# Patient Record
Sex: Male | Born: 1960 | Race: White | Hispanic: No | Marital: Married | Smoking: Former smoker
Health system: Southern US, Community
[De-identification: ages and names within clinical notes are randomized; demographics above are authoritative.]

## PROBLEM LIST (undated history)

## (undated) DIAGNOSIS — G459 Transient cerebral ischemic attack, unspecified: Secondary | ICD-10-CM

## (undated) DIAGNOSIS — I639 Cerebral infarction, unspecified: Secondary | ICD-10-CM

## (undated) DIAGNOSIS — J329 Chronic sinusitis, unspecified: Secondary | ICD-10-CM

## (undated) DIAGNOSIS — I10 Essential (primary) hypertension: Secondary | ICD-10-CM

## (undated) DIAGNOSIS — E785 Hyperlipidemia, unspecified: Secondary | ICD-10-CM

## (undated) DIAGNOSIS — M109 Gout, unspecified: Secondary | ICD-10-CM

## (undated) DIAGNOSIS — Z8673 Personal history of transient ischemic attack (TIA), and cerebral infarction without residual deficits: Secondary | ICD-10-CM

## (undated) DIAGNOSIS — I2510 Atherosclerotic heart disease of native coronary artery without angina pectoris: Secondary | ICD-10-CM

## (undated) DIAGNOSIS — N200 Calculus of kidney: Secondary | ICD-10-CM

## (undated) HISTORY — DX: Calculus of kidney: N20.0

---

## 1994-11-19 DIAGNOSIS — I2510 Atherosclerotic heart disease of native coronary artery without angina pectoris: Secondary | ICD-10-CM

## 1994-11-19 HISTORY — DX: Atherosclerotic heart disease of native coronary artery without angina pectoris: I25.10

## 1994-12-12 HISTORY — PX: CORONARY ARTERY BYPASS GRAFT: SHX141

## 2004-09-21 ENCOUNTER — Ambulatory Visit: Payer: Self-pay | Admitting: Cardiovascular Disease

## 2004-09-27 ENCOUNTER — Ambulatory Visit: Payer: Self-pay

## 2012-07-08 ENCOUNTER — Encounter (INDEPENDENT_AMBULATORY_CARE_PROVIDER_SITE_OTHER): Payer: Self-pay | Admitting: *Deleted

## 2012-07-15 ENCOUNTER — Encounter (INDEPENDENT_AMBULATORY_CARE_PROVIDER_SITE_OTHER): Payer: Self-pay | Admitting: *Deleted

## 2012-07-30 ENCOUNTER — Encounter (INDEPENDENT_AMBULATORY_CARE_PROVIDER_SITE_OTHER): Payer: Self-pay | Admitting: *Deleted

## 2012-07-30 ENCOUNTER — Telehealth (INDEPENDENT_AMBULATORY_CARE_PROVIDER_SITE_OTHER): Payer: Self-pay | Admitting: *Deleted

## 2012-07-30 NOTE — Telephone Encounter (Signed)
Wife [place] Sink called and wanted to set-up TCS  Did a very brief [place] Knock and gave date it may work at hospital.  She had lots of dates that would not work due to vacation etc.  He had bypass surgery back when he was in his 38's and now has a new small blockage.  He is on  ASA 81 mg 2 daily   Questor 20 mg 1 daily   Zetia 10 mg 1 daily   Metroprolonol 25 mg 1 am and 1 pm   Prilosec 20 mg 1 PRN  This is for a screening and he does have a f/h grandfather around 6 and did die from metastatic disease  Told her 7/17 @ 1 be at hosptial at 12  But you review and call her back with questions

## 2012-07-31 ENCOUNTER — Encounter (INDEPENDENT_AMBULATORY_CARE_PROVIDER_SITE_OTHER): Payer: Self-pay | Admitting: *Deleted

## 2012-07-31 ENCOUNTER — Other Ambulatory Visit (INDEPENDENT_AMBULATORY_CARE_PROVIDER_SITE_OTHER): Payer: Self-pay | Admitting: *Deleted

## 2012-07-31 ENCOUNTER — Telehealth (INDEPENDENT_AMBULATORY_CARE_PROVIDER_SITE_OTHER): Payer: Self-pay | Admitting: *Deleted

## 2012-07-31 DIAGNOSIS — Z1211 Encounter for screening for malignant neoplasm of colon: Secondary | ICD-10-CM

## 2012-07-31 DIAGNOSIS — Z800 Family history of malignant neoplasm of digestive organs: Secondary | ICD-10-CM

## 2012-07-31 NOTE — Telephone Encounter (Signed)
Triage reviewed, instructions mailed to patient

## 2012-07-31 NOTE — Telephone Encounter (Signed)
Patient needs movi prep 

## 2012-07-31 NOTE — Progress Notes (Signed)
This encounter was created in error - please disregard.

## 2012-08-03 MED ORDER — PEG-KCL-NACL-NASULF-NA ASC-C 100 G PO SOLR: 1.0000 | Freq: Once | ORAL | Status: DC

## 2012-08-04 ENCOUNTER — Telehealth (INDEPENDENT_AMBULATORY_CARE_PROVIDER_SITE_OTHER): Payer: Self-pay | Admitting: *Deleted

## 2012-08-04 NOTE — Telephone Encounter (Signed)
  Procedure: tcs  Reason/Indication:  Screening, fam hx colon ca  Has patient had this procedure before?  no  If so, when, by whom and where?    Is there a family history of colon cancer?  Yes, grandfather  Who?  What age when diagnosed?    Is patient diabetic?   no      Does patient have prosthetic heart valve?  no  Do you have a pacemaker?  no  Has patient ever had endocarditis? no  Has patient had joint replacement within last 12 months?  no  Is patient on Coumadin, Plavix and/or Aspirin? yes  Medications: asa 81 mg bid, crestor 20 mg daily, zetia 10 mg daily, metoprolol 25 mg 1 in am & 1 in pm, prilosec 20 mg prn  Allergies: nkda  Medication Adjustment: asa 2 days  Procedure date & time: 09/03/12 at 1200

## 2012-08-04 NOTE — Telephone Encounter (Signed)
agree

## 2012-08-20 ENCOUNTER — Encounter (HOSPITAL_COMMUNITY): Payer: Self-pay | Admitting: Pharmacy Technician

## 2012-09-03 ENCOUNTER — Encounter (HOSPITAL_COMMUNITY): Payer: Self-pay

## 2012-09-03 ENCOUNTER — Ambulatory Visit (HOSPITAL_COMMUNITY)
Admission: RE | Admit: 2012-09-03 | Discharge: 2012-09-03 | Disposition: A | Discharge status: Home / Self Care | Payer: BC Managed Care – PPO | Source: Ambulatory Visit | Attending: Internal Medicine | Admitting: Internal Medicine

## 2012-09-03 ENCOUNTER — Encounter (HOSPITAL_COMMUNITY)
Admission: RE | Disposition: A | Discharge status: Home / Self Care | Payer: Self-pay | Source: Ambulatory Visit | Attending: Internal Medicine

## 2012-09-03 DIAGNOSIS — Z800 Family history of malignant neoplasm of digestive organs: Secondary | ICD-10-CM

## 2012-09-03 DIAGNOSIS — Z1211 Encounter for screening for malignant neoplasm of colon: Secondary | ICD-10-CM

## 2012-09-03 DIAGNOSIS — D126 Benign neoplasm of colon, unspecified: Secondary | ICD-10-CM

## 2012-09-03 DIAGNOSIS — Z7982 Long term (current) use of aspirin: Secondary | ICD-10-CM | POA: Insufficient documentation

## 2012-09-03 DIAGNOSIS — I2510 Atherosclerotic heart disease of native coronary artery without angina pectoris: Secondary | ICD-10-CM | POA: Insufficient documentation

## 2012-09-03 DIAGNOSIS — Z951 Presence of aortocoronary bypass graft: Secondary | ICD-10-CM | POA: Insufficient documentation

## 2012-09-03 DIAGNOSIS — I10 Essential (primary) hypertension: Secondary | ICD-10-CM | POA: Insufficient documentation

## 2012-09-03 DIAGNOSIS — Z79899 Other long term (current) drug therapy: Secondary | ICD-10-CM | POA: Insufficient documentation

## 2012-09-03 HISTORY — PX: COLONOSCOPY: SHX5424

## 2012-09-03 SURGERY — COLONOSCOPY
Anesthesia: Moderate Sedation

## 2012-09-03 MED ORDER — STERILE WATER FOR IRRIGATION IR SOLN
Status: DC | PRN
  Administered 2012-09-03: 12:00:00

## 2012-09-03 MED ORDER — MIDAZOLAM HCL 5 MG/5ML IJ SOLN
INTRAMUSCULAR | Status: AC
  Filled 2012-09-03: qty 10

## 2012-09-03 MED ORDER — MEPERIDINE HCL 50 MG/ML IJ SOLN
INTRAMUSCULAR | Status: AC
  Filled 2012-09-03: qty 1

## 2012-09-03 MED ORDER — MEPERIDINE HCL 50 MG/ML IJ SOLN
INTRAMUSCULAR | Status: DC | PRN
  Administered 2012-09-03 (×2): 25 mg via INTRAVENOUS

## 2012-09-03 MED ORDER — SODIUM CHLORIDE 0.9 % IV SOLN
INTRAVENOUS | Status: DC
  Administered 2012-09-03: 11:00:00 via INTRAVENOUS

## 2012-09-03 MED ORDER — MIDAZOLAM HCL 5 MG/5ML IJ SOLN
INTRAMUSCULAR | Status: DC | PRN
  Administered 2012-09-03 (×3): 2 mg via INTRAVENOUS
  Administered 2012-09-03: 1 mg via INTRAVENOUS

## 2012-09-03 NOTE — H&P (Signed)
Justin Sexton is an 52 y.o. male.   Chief Complaint: Patient is here for colonoscopy. HPI: Patient is 52 year old Caucasian male who presents for screening colonoscopy. He denies abdominal pain change in bowel habits or rectal bleeding. Family history is negative for colorectal carcinoma.  Past Medical History  Diagnosis Date  . Hypertension   . H/O coronary artery bypass surgery     16 years ago, [place]    History reviewed. No pertinent past surgical history.  History reviewed. No pertinent family history. Social History:  reports that he has never smoked. He does not have any smokeless tobacco history on file. He reports that he does not drink alcohol or use illicit drugs.  Allergies: No Known Allergies  Medications Prior to Admission  Medication Sig Dispense Refill  . aspirin 325 MG tablet Take 325 mg by mouth daily.      . calcium-vitamin D (OSCAL WITH D) 500-200 MG-UNIT per tablet Take 1 tablet by mouth daily.      Marland Kitchen ezetimibe (ZETIA) 10 MG tablet Take 10 mg by mouth daily.      . fish oil-omega-3 fatty acids 1000 MG capsule Take 1 g by mouth daily.      . metoprolol (LOPRESSOR) 50 MG tablet Take 25 mg by mouth 2 (two) times daily.      . peg 3350 powder (MOVIPREP) 100 G SOLR Take 1 kit (100 g total) by mouth once.  1 kit  0  . rosuvastatin (CRESTOR) 20 MG tablet Take 20 mg by mouth daily.        No results found for this or any previous visit (from the past 48 hour(s)). No results found.  ROS  Blood pressure 134/74, pulse 59, temperature 98 F (36.7 C), temperature source Oral, resp. rate 18, height 5\' 10"  (1.778 m), weight 265 lb (120.203 kg), SpO2 95.00%. Physical Exam  Constitutional: He appears well-developed and well-nourished.  HENT:  Mouth/Throat: Oropharynx is clear and moist.  Eyes: Conjunctivae are normal. No scleral icterus.  Neck: No thyromegaly present.  Cardiovascular: Normal rate, regular rhythm and normal heart sounds.   No murmur  heard. Respiratory: Effort normal and breath sounds normal.  GI: He exhibits no distension and no mass. There is no tenderness.  Musculoskeletal: He exhibits no edema.  Lymphadenopathy:    He has no cervical adenopathy.  Neurological: He is alert.  Skin: Skin is warm and dry.     Assessment/Plan Average risk screening colonoscopy.  [name],[name] U 09/03/2012, 11:32 AM

## 2012-09-03 NOTE — Op Note (Signed)
COLONOSCOPY PROCEDURE REPORT  PATIENT:  Justin Sexton  MR#:  [id number] Birthdate:  Oct 29, 1960, 52 y.o., male Endoscopist:  Dr. Malissa Hippo, MD Referred By:  Dr. Ignatius Specking, MD Procedure Date: 09/03/2012  Procedure:   Colonoscopy  Indications: Patient is 52 year old Caucasian male who is undergoing average risk screening colonoscopy.  Informed Consent:  The procedure and risks were reviewed with the patient and informed consent was obtained.  Medications:  Demerol 50 mg IV Versed 7 mg IV  Description of procedure:  After a digital rectal exam was performed, that colonoscope was advanced from the anus through the rectum and colon to the area of the cecum, ileocecal valve and appendiceal orifice. The cecum was deeply intubated. These structures were well-seen and photographed for the record. From the level of the cecum and ileocecal valve, the scope was slowly and cautiously withdrawn. The mucosal surfaces were carefully surveyed utilizing scope tip to flexion to facilitate fold flattening as needed. The scope was pulled down into the rectum where a thorough exam including retroflexion was performed.  Findings:   Prep satisfactory. Two small polyps ablated via cold biopsy and submitted together. One was located at transverse colon and the second one at sigmoid colon. No other mucosal abnormalities noted. Rectal mucosa and anal rectal junction unremarkable.   Therapeutic/Diagnostic Maneuvers Performed:  See above  Complications:  None  Cecal Withdrawal Time:  15 minutes  Impression:  Examination performed to cecum. Two small polyps ablated via cold biopsy and submitted together(transverse and sigmoid colon).  Recommendations:  Standard instructions given. I will contact patient with biopsy results and further recommendations.   Coni Homesley U  09/03/2012 12:17 PM  CC: Dr. Ignatius Specking., MD & Dr. Bonnetta Barry ref. provider found

## 2012-09-04 ENCOUNTER — Encounter (HOSPITAL_COMMUNITY): Payer: Self-pay | Admitting: Internal Medicine

## 2012-09-09 ENCOUNTER — Encounter (INDEPENDENT_AMBULATORY_CARE_PROVIDER_SITE_OTHER): Payer: Self-pay | Admitting: *Deleted

## 2013-09-18 HISTORY — PX: OTHER SURGICAL HISTORY: SHX169

## 2013-09-28 ENCOUNTER — Inpatient Hospital Stay (HOSPITAL_BASED_OUTPATIENT_CLINIC_OR_DEPARTMENT_OTHER)
Admission: EM | Admit: 2013-09-28 | Discharge: 2013-10-01 | DRG: 042 | Disposition: A | Discharge status: Home / Self Care | Payer: BC Managed Care – PPO | Attending: Internal Medicine | Admitting: Internal Medicine

## 2013-09-28 ENCOUNTER — Emergency Department (HOSPITAL_BASED_OUTPATIENT_CLINIC_OR_DEPARTMENT_OTHER): Payer: BC Managed Care – PPO

## 2013-09-28 ENCOUNTER — Encounter (HOSPITAL_BASED_OUTPATIENT_CLINIC_OR_DEPARTMENT_OTHER): Payer: Self-pay | Admitting: Emergency Medicine

## 2013-09-28 ENCOUNTER — Observation Stay (HOSPITAL_COMMUNITY): Payer: BC Managed Care – PPO

## 2013-09-28 DIAGNOSIS — Z87891 Personal history of nicotine dependence: Secondary | ICD-10-CM

## 2013-09-28 DIAGNOSIS — R209 Unspecified disturbances of skin sensation: Secondary | ICD-10-CM | POA: Diagnosis present

## 2013-09-28 DIAGNOSIS — I6350 Cerebral infarction due to unspecified occlusion or stenosis of unspecified cerebral artery: Secondary | ICD-10-CM | POA: Diagnosis not present

## 2013-09-28 DIAGNOSIS — I10 Essential (primary) hypertension: Secondary | ICD-10-CM | POA: Diagnosis present

## 2013-09-28 DIAGNOSIS — G459 Transient cerebral ischemic attack, unspecified: Secondary | ICD-10-CM

## 2013-09-28 DIAGNOSIS — I639 Cerebral infarction, unspecified: Secondary | ICD-10-CM

## 2013-09-28 DIAGNOSIS — G458 Other transient cerebral ischemic attacks and related syndromes: Secondary | ICD-10-CM | POA: Diagnosis not present

## 2013-09-28 DIAGNOSIS — M109 Gout, unspecified: Secondary | ICD-10-CM | POA: Diagnosis present

## 2013-09-28 DIAGNOSIS — Z951 Presence of aortocoronary bypass graft: Secondary | ICD-10-CM

## 2013-09-28 DIAGNOSIS — I2510 Atherosclerotic heart disease of native coronary artery without angina pectoris: Secondary | ICD-10-CM | POA: Diagnosis present

## 2013-09-28 DIAGNOSIS — R5383 Other fatigue: Secondary | ICD-10-CM

## 2013-09-28 DIAGNOSIS — Z79899 Other long term (current) drug therapy: Secondary | ICD-10-CM

## 2013-09-28 DIAGNOSIS — E785 Hyperlipidemia, unspecified: Secondary | ICD-10-CM | POA: Diagnosis present

## 2013-09-28 DIAGNOSIS — R5381 Other malaise: Secondary | ICD-10-CM | POA: Diagnosis present

## 2013-09-28 DIAGNOSIS — Z8673 Personal history of transient ischemic attack (TIA), and cerebral infarction without residual deficits: Secondary | ICD-10-CM

## 2013-09-28 DIAGNOSIS — Z8249 Family history of ischemic heart disease and other diseases of the circulatory system: Secondary | ICD-10-CM

## 2013-09-28 HISTORY — DX: Hyperlipidemia, unspecified: E78.5

## 2013-09-28 HISTORY — DX: Transient cerebral ischemic attack, unspecified: G45.9

## 2013-09-28 HISTORY — DX: Gout, unspecified: M10.9

## 2013-09-28 LAB — CBC
HCT: 45.1 % (ref 39.0–52.0)
HEMOGLOBIN: 15.4 g/dL (ref 13.0–17.0)
MCH: 30.4 pg (ref 26.0–34.0)
MCHC: 34.1 g/dL (ref 30.0–36.0)
MCV: 89.1 fL (ref 78.0–100.0)
Platelets: 202 10*3/uL (ref 150–400)
RBC: 5.06 MIL/uL (ref 4.22–5.81)
RDW: 11.8 % (ref 11.5–15.5)
WBC: 7.7 10*3/uL (ref 4.0–10.5)

## 2013-09-28 LAB — CBC WITH DIFFERENTIAL/PLATELET
BASOS ABS: 0 10*3/uL (ref 0.0–0.1)
BASOS PCT: 0 % (ref 0–1)
EOS ABS: 0.2 10*3/uL (ref 0.0–0.7)
EOS PCT: 3 % (ref 0–5)
HEMATOCRIT: 45.1 % (ref 39.0–52.0)
Hemoglobin: 15.3 g/dL (ref 13.0–17.0)
Lymphocytes Relative: 31 % (ref 12–46)
Lymphs Abs: 2.3 10*3/uL (ref 0.7–4.0)
MCH: 30.3 pg (ref 26.0–34.0)
MCHC: 33.9 g/dL (ref 30.0–36.0)
MCV: 89.3 fL (ref 78.0–100.0)
MONO ABS: 0.5 10*3/uL (ref 0.1–1.0)
Monocytes Relative: 7 % (ref 3–12)
NEUTROS ABS: 4.3 10*3/uL (ref 1.7–7.7)
Neutrophils Relative %: 58 % (ref 43–77)
Platelets: 175 10*3/uL (ref 150–400)
RBC: 5.05 MIL/uL (ref 4.22–5.81)
RDW: 11.7 % (ref 11.5–15.5)
WBC: 7.3 10*3/uL (ref 4.0–10.5)

## 2013-09-28 LAB — COMPREHENSIVE METABOLIC PANEL
ALBUMIN: 4.5 g/dL (ref 3.5–5.2)
ALT: 35 U/L (ref 0–53)
AST: 27 U/L (ref 0–37)
Alkaline Phosphatase: 86 U/L (ref 39–117)
Anion gap: 16 — ABNORMAL HIGH (ref 5–15)
BUN: 19 mg/dL (ref 6–23)
CALCIUM: 10.1 mg/dL (ref 8.4–10.5)
CO2: 22 mEq/L (ref 19–32)
CREATININE: 1 mg/dL (ref 0.50–1.35)
Chloride: 104 mEq/L (ref 96–112)
GFR calc non Af Amer: 85 mL/min — ABNORMAL LOW (ref >90)
Glucose, Bld: 141 mg/dL — ABNORMAL HIGH (ref 70–99)
Potassium: 4.2 mEq/L (ref 3.7–5.3)
Sodium: 142 mEq/L (ref 137–147)
TOTAL PROTEIN: 8 g/dL (ref 6.0–8.3)
Total Bilirubin: 0.6 mg/dL (ref 0.3–1.2)

## 2013-09-28 LAB — TROPONIN I: Troponin I: <0.3 ng/mL (ref <0.30)

## 2013-09-28 LAB — CBG MONITORING, ED: Glucose-Capillary: 126 mg/dL — ABNORMAL HIGH (ref 70–99)

## 2013-09-28 LAB — CREATININE, SERUM
Creatinine, Ser: 0.82 mg/dL (ref 0.50–1.35)
GFR calc Af Amer: >90 mL/min (ref >90)

## 2013-09-28 MED ORDER — LORATADINE 10 MG PO TABS
10.0000 mg | ORAL_TABLET | Freq: Every day | ORAL | Status: DC
  Administered 2013-09-29 – 2013-09-30 (×3): 10 mg via ORAL
  Filled 2013-09-28 (×3): qty 1

## 2013-09-28 MED ORDER — SENNOSIDES-DOCUSATE SODIUM 8.6-50 MG PO TABS: 1.0000 | ORAL_TABLET | Freq: Every evening | ORAL | Status: DC | PRN

## 2013-09-28 MED ORDER — NITROGLYCERIN 0.4 MG SL SUBL: 0.4000 mg | SUBLINGUAL_TABLET | SUBLINGUAL | Status: DC | PRN

## 2013-09-28 MED ORDER — METOPROLOL TARTRATE 25 MG PO TABS
25.0000 mg | ORAL_TABLET | Freq: Two times a day (BID) | ORAL | Status: DC
  Administered 2013-09-28 – 2013-09-30 (×5): 25 mg via ORAL
  Filled 2013-09-28 (×5): qty 1

## 2013-09-28 MED ORDER — LEVOCETIRIZINE DIHYDROCHLORIDE 5 MG PO TABS
5.0000 mg | ORAL_TABLET | Freq: Every day | ORAL | Status: DC
  Filled 2013-09-28: qty 1

## 2013-09-28 MED ORDER — ATORVASTATIN CALCIUM 40 MG PO TABS
40.0000 mg | ORAL_TABLET | Freq: Every day | ORAL | Status: DC
  Administered 2013-09-29 – 2013-10-01 (×3): 40 mg via ORAL
  Filled 2013-09-28 (×4): qty 1

## 2013-09-28 MED ORDER — ENOXAPARIN SODIUM 30 MG/0.3ML ~~LOC~~ SOLN
30.0000 mg | SUBCUTANEOUS | Status: DC
  Administered 2013-09-28 – 2013-09-30 (×3): 30 mg via SUBCUTANEOUS
  Filled 2013-09-28 (×3): qty 0.3

## 2013-09-28 MED ORDER — EZETIMIBE 10 MG PO TABS
10.0000 mg | ORAL_TABLET | Freq: Every day | ORAL | Status: DC
  Administered 2013-09-28 – 2013-09-30 (×3): 10 mg via ORAL
  Filled 2013-09-28 (×3): qty 1

## 2013-09-28 MED ORDER — STROKE: EARLY STAGES OF RECOVERY BOOK
Freq: Once | Status: AC
  Administered 2013-09-28: 20:00:00
  Filled 2013-09-28: qty 1

## 2013-09-28 MED ORDER — ASPIRIN 325 MG PO TABS
325.0000 mg | ORAL_TABLET | Freq: Every day | ORAL | Status: DC
  Administered 2013-09-28 – 2013-09-30 (×3): 325 mg via ORAL
  Filled 2013-09-28 (×3): qty 1

## 2013-09-28 NOTE — ED Provider Notes (Signed)
CSN: [id number]     Arrival date & time 09/28/13  1306 History   First MD Initiated Contact with Patient 09/28/13 1325     Chief Complaint  Patient presents with  . Extremity Weakness     (Consider location/radiation/quality/duration/timing/severity/associated sxs/prior Treatment) Patient is a 53 y.o. male presenting with weakness. The history is provided by the patient. No language interpreter was used.  Weakness This is a new problem. The current episode started today. The problem occurs constantly. The problem has been resolved. Associated symptoms include weakness. Nothing aggravates the symptoms. He has tried nothing for the symptoms. The treatment provided no relief.   Pt began having numbness and tingling to his right arm and right leg around 12;30.   Pt was sweeping at work and notice it was hard to move his right arm and his right leg.   Pt reports he had a TIA several years ago and was concerned because smptoms are similar.  Pt reports symptoms almost resolved by the time he got here.   Pt reports some tingling in his arm that resolved prior to Ct.   Pt has history of htn, cad with bypass and hyperglycemia.   Past Medical History  Diagnosis Date  . Hypertension   . H/O coronary artery bypass surgery     16 years ago, [place]  . TIA (transient ischemic attack)   . Hyperlipidemia   . Gout    Past Surgical History  Procedure Laterality Date  . Colonoscopy N/A 09/03/2012    Procedure: COLONOSCOPY;  Surgeon: Rogene Houston, MD;  [facility]: [facility];  Service: Endoscopy;  Laterality: N/A;  1200  . Coronary artery bypass graft     No family history on file. History  Substance Use Topics  . Smoking status: Former Research scientist (life sciences)  . Smokeless tobacco: Not on file  . Alcohol Use: No    Review of Systems  Neurological: Positive for weakness.  All other systems reviewed and are negative.     Allergies  Codeine  Home Medications   Prior to Admission medications    Medication Sig Start Date End Date Taking? Authorizing Provider  aspirin 325 MG tablet Take 325 mg by mouth daily.    Historical Provider, MD  calcium-vitamin D (OSCAL WITH D) 500-200 MG-UNIT per tablet Take 1 tablet by mouth daily.    Historical Provider, MD  ezetimibe (ZETIA) 10 MG tablet Take 10 mg by mouth daily.    Historical Provider, MD  fish oil-omega-3 fatty acids 1000 MG capsule Take 1 g by mouth daily.    Historical Provider, MD  metoprolol (LOPRESSOR) 50 MG tablet Take 25 mg by mouth 2 (two) times daily.    Historical Provider, MD  rosuvastatin (CRESTOR) 20 MG tablet Take 20 mg by mouth daily.    Historical Provider, MD   BP 178/88  Pulse 88  Temp(Src) 98.1 F (36.7 C) (Oral)  Resp 18  Ht 5\' 10"  (1.778 m)  Wt 276 lb (125.193 kg)  BMI 39.60 kg/m2  SpO2 94% Physical Exam  Nursing note and vitals reviewed. Constitutional: He appears well-developed and well-nourished.  HENT:  Head: Normocephalic and atraumatic.  Eyes: Conjunctivae are normal. Pupils are equal, round, and reactive to light.  Neck: Normal range of motion. Neck supple.  Cardiovascular: Normal rate, regular rhythm and normal heart sounds.   Pulmonary/Chest: Effort normal and breath sounds normal.  Abdominal: Soft.  Musculoskeletal: Normal range of motion.  Neurological: He is alert. He displays normal reflexes. No  cranial nerve deficit. He exhibits normal muscle tone. Coordination normal.  Skin: Skin is warm.  Psychiatric: He has a normal mood and affect.    ED Course  Procedures (including critical care time) Labs Review Labs Reviewed  CBG MONITORING, ED - Abnormal; Notable for the following:    Glucose-Capillary 126 (*)    All other components within normal limits  TROPONIN I  CBC WITH DIFFERENTIAL  COMPREHENSIVE METABOLIC PANEL    Imaging Review No results found.   EKG Interpretation   Date/Time:  Tuesday September 28 2013 13:28:13 EDT Ventricular Rate:  71 PR Interval:  150 QRS Duration:  90 QT Interval:  392 QTC Calculation: 425 R Axis:   28 Text Interpretation:  Normal sinus rhythm Low voltage QRS Cannot rule out  Anterior infarct , age undetermined Abnormal ECG No previous tracing  Confirmed by Betsey Holiday  MD, CHRISTOPHER [phone]) on 09/28/2013 1:30:53 PM      MDM   Final diagnoses:  None    Results for orders placed during the hospital encounter of 09/28/13  TROPONIN I      Result Value Ref Range   Troponin I <0.30  <0.30 ng/mL  CBC WITH DIFFERENTIAL      Result Value Ref Range   WBC 7.3  4.0 - 10.5 K/uL   RBC 5.05  4.22 - 5.81 MIL/uL   Hemoglobin 15.3  13.0 - 17.0 g/dL   HCT 45.1  39.0 - 52.0 %   MCV 89.3  78.0 - 100.0 fL   MCH 30.3  26.0 - 34.0 pg   MCHC 33.9  30.0 - 36.0 g/dL   RDW 11.7  11.5 - 15.5 %   Platelets 175  150 - 400 K/uL   Neutrophils Relative % 58  43 - 77 %   Neutro Abs 4.3  1.7 - 7.7 K/uL   Lymphocytes Relative 31  12 - 46 %   Lymphs Abs 2.3  0.7 - 4.0 K/uL   Monocytes Relative 7  3 - 12 %   Monocytes Absolute 0.5  0.1 - 1.0 K/uL   Eosinophils Relative 3  0 - 5 %   Eosinophils Absolute 0.2  0.0 - 0.7 K/uL   Basophils Relative 0  0 - 1 %   Basophils Absolute 0.0  0.0 - 0.1 K/uL  COMPREHENSIVE METABOLIC PANEL      Result Value Ref Range   Sodium 142  137 - 147 mEq/L   Potassium 4.2  3.7 - 5.3 mEq/L   Chloride 104  96 - 112 mEq/L   CO2 22  19 - 32 mEq/L   Glucose, Bld 141 (*) 70 - 99 mg/dL   BUN 19  6 - 23 mg/dL   Creatinine, Ser 1.00  0.50 - 1.35 mg/dL   Calcium 10.1  8.4 - 10.5 mg/dL   Total Protein 8.0  6.0 - 8.3 g/dL   Albumin 4.5  3.5 - 5.2 g/dL   AST 27  0 - 37 U/L   ALT 35  0 - 53 U/L   Alkaline Phosphatase 86  39 - 117 U/L   Total Bilirubin 0.6  0.3 - 1.2 mg/dL   GFR calc non Af Amer 85 (*) >90 mL/min   GFR calc Af Amer >90  >90 mL/min   Anion gap 16 (*) 5 - 15  CBG MONITORING, ED      Result Value Ref Range   Glucose-Capillary 126 (*) 70 - 99 mg/dL   Ct Head Wo Contrast  09/28/2013  CLINICAL DATA:  Extremity  weakness  EXAM: CT HEAD WITHOUT CONTRAST  TECHNIQUE: Contiguous axial images were obtained from the base of the skull through the vertex without intravenous contrast.  COMPARISON:  None.  FINDINGS: There is no acute intracranial hemorrhage or infarct. No mass lesion or midline shift. Gray-white matter differentiation is well maintained. Ventricles are normal in size without evidence of hydrocephalus. CSF containing spaces are within normal limits. No extra-axial fluid collection.  The calvarium is intact.  Orbital soft tissues are within normal limits.  The paranasal sinuses and mastoid air cells are well pneumatized and free of fluid.  Scalp soft tissues are unremarkable.  IMPRESSION: Normal head CT with no acute intracranial process identified.   Electronically Signed   By: Jeannine Boga M.D.   On: 09/28/2013 13:44       Fransico Meadow, PA-C 09/28/13 1531

## 2013-09-28 NOTE — Progress Notes (Signed)
Spoke with PA-C Sofia about transfer from [name]  53 year old male with a history of hypertension, hyperlipidemia, coronary artery disease with history of CABG. The patient was at work today when he had sudden onset of right upper extremity and right lower extremity numbness and tingling and weakness. The patient also claimed that he had difficulty moving his right arm or leg. Patient presented to the emergency department. In the emergency department, the patient's weakness almost completely resolved but he continued to have some numbness and tingling. CT of the brain was obtained. A time he came back from CT, the patient's symptoms had completely resolved.  EKGs show sinus rhythm without ST changes. BMP was unremarkable except for serum glucose 131. Hepatic enzymes were negative. CBC was unremarkable. CT brain was negative. The patient was accepted for telemetry bed.  Justin Sexton

## 2013-09-28 NOTE — ED Notes (Signed)
Patient transported to CT 

## 2013-09-28 NOTE — ED Notes (Signed)
pa at bedside. 

## 2013-09-28 NOTE — ED Notes (Signed)
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  States symptoms are minimal now.

## 2013-09-28 NOTE — H&P (Signed)
Hospitalist Admission History and Physical  Patient name: Justin Sexton Medical record number: [id number] Date of birth: [birth year] Age: 53 y.o. Gender: male  Primary Care Provider: Glenda Chroman., MD  Chief Complaint: TIA  History of Present Illness:This is a 53 y.o. year old male with significant past medical history of HLD, HTN, CAD s/p CABG around 1999, prior TIA  presenting with TIA. Pt states that while at work, he had sudden onset of R sided hand and leg numbness/weakness. Pt states that sxs persisted for almost 1 hour and then resolved. Reports some mild dizziness assd with sxs, but no syncope, falls. No CP, SOB, nausea. Tried to dial on the phone with his R hand, but was unable to do so over the course of the hour. Pt was brought to [facility] for furhter evaluation. Workup essentially negative including Head CT,  CBC, BMET. Trop, EKG NSR. Pt admitted for TIA workup. States that he takes 2 baby ASA daily.    Assessment and Plan: Justin Sexton is a 53 y.o. year old male presenting with TIA   Active Problems:   TIA (transient ischemic attack)   1-TIA -Proceed down stroke protocol including MRI/MRA, 2D ECHO, carotid dopplers, risk stratification labs -Neuro c/s -Full dose ASA pending neuro recs   2-HTN/CAD -No active CP currently  -cycle CEs given above  -continue home regimen  -tele bed   3-HLD  -statin, zetia   FEN/GI: heart healthy diet pending bedside swallow eval  Prophylaxis: lovenox  Disposition: pending further evaluation  Code Status:Full Code    Patient Active Problem List   Diagnosis Date Noted  . TIA (transient ischemic attack) 09/28/2013   Past Medical History: Past Medical History  Diagnosis Date  . Hypertension   . H/O coronary artery bypass surgery     16 years ago, [place]  . TIA (transient ischemic attack)   . Hyperlipidemia   . Gout     Past Surgical History: Past Surgical History  Procedure Laterality Date  . Colonoscopy N/A 09/03/2012   Procedure: COLONOSCOPY;  Surgeon: Rogene Houston, MD;  Location: [facility];  Service: Endoscopy;  Laterality: N/A;  1200  . Coronary artery bypass graft      Social History: History   Social History  . Marital Status: Married    Spouse Name: N/A    Number of Children: N/A  . Years of Education: N/A   Social History Main Topics  . Smoking status: Former Research scientist (life sciences)  . Smokeless tobacco: None  . Alcohol Use: No  . Drug Use: No  . Sexual Activity: None   Other Topics Concern  . None   Social History Narrative  . None    Family History: No family history on file.  Allergies: Allergies  Allergen Reactions  . Codeine Nausea And Vomiting    Current Facility-Administered Medications  Medication Dose Route Frequency Provider Last Rate Last Dose  .  stroke: mapping our early stages of recovery book   Does not apply Once Shanda Howells, MD      . enoxaparin (LOVENOX) injection 30 mg  30 mg Subcutaneous Q24H Shanda Howells, MD      . senna-docusate (Senokot-S) tablet 1 tablet  1 tablet Oral QHS PRN Shanda Howells, MD       Review Of Systems: 12 point ROS negative except as noted above in HPI.  Physical Exam: Filed Vitals:   09/28/13 1751  BP: 149/72  Pulse: 67  Temp: 98.1 F (36.7 C)  Resp:  20    General: alert, cooperative and moderately obese HEENT: PERRLA and extra ocular movement intact Heart: S1, S2 normal, no murmur, rub or gallop, regular rate and rhythm Lungs: clear to auscultation, no wheezes or rales and unlabored breathing Abdomen: abdomen is soft without significant tenderness, masses, organomegaly or guarding Extremities: extremities normal, atraumatic, no cyanosis or edema Skin:no rashes Neurology: normal without focal findings  Labs and Imaging: Lab Results  Component Value Date/Time   NA 142 09/28/2013  1:25 PM   K 4.2 09/28/2013  1:25 PM   CL 104 09/28/2013  1:25 PM   CO2 22 09/28/2013  1:25 PM   BUN 19 09/28/2013  1:25 PM   CREATININE 1.00  09/28/2013  1:25 PM   GLUCOSE 141* 09/28/2013  1:25 PM   Lab Results  Component Value Date   WBC 7.3 09/28/2013   HGB 15.3 09/28/2013   HCT 45.1 09/28/2013   MCV 89.3 09/28/2013   PLT 175 09/28/2013    Ct Head Wo Contrast  09/28/2013   CLINICAL DATA:  Extremity weakness  EXAM: CT HEAD WITHOUT CONTRAST  TECHNIQUE: Contiguous axial images were obtained from the base of the skull through the vertex without intravenous contrast.  COMPARISON:  None.  FINDINGS: There is no acute intracranial hemorrhage or infarct. No mass lesion or midline shift. Gray-white matter differentiation is well maintained. Ventricles are normal in size without evidence of hydrocephalus. CSF containing spaces are within normal limits. No extra-axial fluid collection.  The calvarium is intact.  Orbital soft tissues are within normal limits.  The paranasal sinuses and mastoid air cells are well pneumatized and free of fluid.  Scalp soft tissues are unremarkable.  IMPRESSION: Normal head CT with no acute intracranial process identified.   Electronically Signed   By: Jeannine Boga M.D.   On: 09/28/2013 13:44           Shanda Howells MD  Pager: [phone]

## 2013-09-29 ENCOUNTER — Encounter (HOSPITAL_COMMUNITY): Payer: Self-pay | Admitting: Neurology

## 2013-09-29 ENCOUNTER — Inpatient Hospital Stay (HOSPITAL_COMMUNITY): Payer: BC Managed Care – PPO

## 2013-09-29 DIAGNOSIS — Z87891 Personal history of nicotine dependence: Secondary | ICD-10-CM | POA: Diagnosis not present

## 2013-09-29 DIAGNOSIS — I10 Essential (primary) hypertension: Secondary | ICD-10-CM | POA: Diagnosis present

## 2013-09-29 DIAGNOSIS — I2510 Atherosclerotic heart disease of native coronary artery without angina pectoris: Secondary | ICD-10-CM | POA: Diagnosis present

## 2013-09-29 DIAGNOSIS — Z8673 Personal history of transient ischemic attack (TIA), and cerebral infarction without residual deficits: Secondary | ICD-10-CM | POA: Diagnosis not present

## 2013-09-29 DIAGNOSIS — I6350 Cerebral infarction due to unspecified occlusion or stenosis of unspecified cerebral artery: Secondary | ICD-10-CM | POA: Diagnosis present

## 2013-09-29 DIAGNOSIS — Z8249 Family history of ischemic heart disease and other diseases of the circulatory system: Secondary | ICD-10-CM | POA: Diagnosis not present

## 2013-09-29 DIAGNOSIS — R209 Unspecified disturbances of skin sensation: Secondary | ICD-10-CM | POA: Diagnosis present

## 2013-09-29 DIAGNOSIS — Z951 Presence of aortocoronary bypass graft: Secondary | ICD-10-CM | POA: Diagnosis not present

## 2013-09-29 DIAGNOSIS — E785 Hyperlipidemia, unspecified: Secondary | ICD-10-CM | POA: Diagnosis present

## 2013-09-29 DIAGNOSIS — R5383 Other fatigue: Secondary | ICD-10-CM | POA: Diagnosis present

## 2013-09-29 DIAGNOSIS — M109 Gout, unspecified: Secondary | ICD-10-CM | POA: Diagnosis present

## 2013-09-29 DIAGNOSIS — R5381 Other malaise: Secondary | ICD-10-CM | POA: Diagnosis present

## 2013-09-29 DIAGNOSIS — Z79899 Other long term (current) drug therapy: Secondary | ICD-10-CM | POA: Diagnosis not present

## 2013-09-29 DIAGNOSIS — G458 Other transient cerebral ischemic attacks and related syndromes: Secondary | ICD-10-CM | POA: Diagnosis present

## 2013-09-29 DIAGNOSIS — I379 Nonrheumatic pulmonary valve disorder, unspecified: Secondary | ICD-10-CM

## 2013-09-29 LAB — LIPID PANEL
CHOL/HDL RATIO: 3.6 ratio
Cholesterol: 126 mg/dL (ref 0–200)
HDL: 35 mg/dL — AB (ref >39)
LDL Cholesterol: 67 mg/dL (ref 0–99)
Triglycerides: 118 mg/dL (ref <150)
VLDL: 24 mg/dL (ref 0–40)

## 2013-09-29 LAB — HEMOGLOBIN A1C
Hgb A1c MFr Bld: 5.9 % — ABNORMAL HIGH (ref <5.7)
Mean Plasma Glucose: 123 mg/dL — ABNORMAL HIGH (ref <117)

## 2013-09-29 LAB — TROPONIN I
Troponin I: <0.3 ng/mL (ref <0.30)
Troponin I: <0.3 ng/mL (ref <0.30)

## 2013-09-29 MED ORDER — IOHEXOL 350 MG/ML SOLN
50.0000 mL | Freq: Once | INTRAVENOUS | Status: AC | PRN
  Administered 2013-09-29: 50 mL via INTRAVENOUS

## 2013-09-29 NOTE — Progress Notes (Signed)
UR Completed Junia Nygren Graves-Bigelow, RN,BSN [phone]  

## 2013-09-29 NOTE — Progress Notes (Addendum)
[facility]                                                                              Patient Demographics  Justin Sexton, is a 53 y.o. male, DOB - November 11, 1960, YBO:[id number]  Admit date - 09/28/2013   Admitting Physician Orson Eva, MD  Outpatient Primary MD for the patient is VYAS,DHRUV B., MD  LOS - 1   Chief Complaint  Patient presents with  . Extremity Weakness      HPI on 09/28/2013 This is a 53 y.o. year old male with significant past medical history of HLD, HTN, CAD s/p CABG around 1999, prior TIA presenting with TIA. Patient stated that while at work, he had sudden onset of R sided hand and leg numbness/weakness. His symptoms persisted for almost 1 hour and then resolved. Reported some mild dizziness, but no syncope, falls. No CP, SOB, nausea. Tried to dial on the phone with his R hand, but was unable to do so over the course of the hour. Patient was brought to [facility] for further evaluation. Workup essentially negative including Head CT, CBC, BMET. Trop, EKG NSR. Patient admitted for TIA workup. Stated that he takes 2 baby ASA daily.    Assessment & Plan   Acute CVA -Right-sided numbness and weakness resolved -CT head showed normal head CT with no acute intracranial process identified. 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 Vertebral arteries are patent with antegrade flow. -Echocardiogram pending -Hemoglobin A1c pending, LDL 67 -Continue aspirin -Neurology consulted and appreciate the -Pending PT and OT evaluations  Hypertension -Stable, continue metoprolol  Coronary artery disease -Currently chest pain-free, troponin negative -Continue metoprolol and aspirin  Hyperlipidemia -LDL 67, continue Lipitor and zetia  Code Status: Full  Family  Communication: Wife at bedside  Disposition Plan: Admitted, will change to inpatient.  Pending further CVA workup.  Time Spent in minutes   30 minutes  Procedures  Carotid doppler: 1-39% right internal carotid artery stenosis and 40-59% left internal carotid artery stenosis. Vertebral arteries are patent with antegrade flow.  Consults   Neurology  DVT Prophylaxis  Lovenox  Lab Results  Component Value Date   PLT 202 09/28/2013    Medications  Scheduled Meds: . aspirin  325 mg Oral Daily  . atorvastatin  40 mg Oral q1800  . enoxaparin (LOVENOX) injection  30 mg Subcutaneous Q24H  . ezetimibe  10 mg Oral Daily  . loratadine  10 mg Oral QHS  . metoprolol  25 mg Oral BID   Continuous Infusions:  PRN Meds:.nitroGLYCERIN, senna-docusate  Antibiotics    Anti-infectives   None      Subjective:   Justin Sexton seen and examined today.  Patient states he no longer feels the weakness or numbness. Patient denies any headache, dizziness, chest pain, shortness of breath, abdominal pain.  Objective:   Filed Vitals:   09/29/13 0400 09/29/13 0630 09/29/13 0800 09/29/13 1000  BP: 115/64 92/42 115/64 103/54  Pulse: 66 64 75 64  Temp: 97.5 F (36.4 C)  97.3 F (36.3 C) 97.2 F (36.2 C) 97.6 F (36.4 C)  TempSrc: Oral Oral Oral Oral  Resp:      Height:      Weight:      SpO2: 97% 96% 98% 98%    Wt Readings from Last 3 Encounters:  09/28/13 125.193 kg (276 lb)  09/03/12 120.203 kg (265 lb)  09/03/12 120.203 kg (265 lb)    No intake or output data in the 24 hours ending 09/29/13 1453  Exam  General: Well developed, well nourished, NAD, appears stated age  HEENT: NCAT, PERRLA, EOMI, Anicteic Sclera, mucous membranes moist.   Neck: Supple, no JVD, no masses  Cardiovascular: S1 S2 auscultated, no rubs, murmurs or gallops. Regular rate and rhythm.  Respiratory: Clear to auscultation bilaterally with equal chest rise  Abdomen: Soft, nontender, nondistended, + bowel  sounds  Extremities: warm dry without cyanosis clubbing or edema  Neuro: AAOx3, cranial nerves grossly intact. Strength 5/5 in patient's upper and lower extremities bilaterally  Skin: Without rashes exudates or nodules  Psych: Normal affect and demeanor with intact judgement and insight  Data Review   Micro Results No results found for this or any previous visit (from the past 240 hour(s)).  Radiology Reports Dg Chest 2 View  09/28/2013   CLINICAL DATA:  Stroke symptoms ; history of CABG and hypertension  EXAM: CHEST  2 VIEW  COMPARISON:  None.  FINDINGS: The lungs are adequately inflated and clear. The heart and mediastinal structures exhibit no acute abnormalities. 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 The bony thorax exhibits no acute abnormality.  IMPRESSION: There is no evidence of CHF nor pneumonia nor other acute cardiopulmonary abnormality.   Electronically Signed   By: David  Martinique   On: 09/28/2013 19:40   Ct Head Wo Contrast  09/28/2013   CLINICAL DATA:  Extremity weakness  EXAM: CT HEAD WITHOUT CONTRAST  TECHNIQUE: Contiguous axial images were obtained from the base of the skull through the vertex without intravenous contrast.  COMPARISON:  None.  FINDINGS: There is no acute intracranial hemorrhage or infarct. No mass lesion or midline shift. Gray-white matter differentiation is well maintained. Ventricles are normal in size without evidence of hydrocephalus. CSF containing spaces are within normal limits. No extra-axial fluid collection.  The calvarium is intact.  Orbital soft tissues are within normal limits.  The paranasal sinuses and mastoid air cells are well pneumatized and free of fluid.  Scalp soft tissues are unremarkable.  IMPRESSION: Normal head CT with no acute intracranial process identified.   Electronically Signed   By: Jeannine Boga M.D.   On: 09/28/2013 13:44   Mr Brain Wo Contrast  09/28/2013   CLINICAL DATA:  Sudden onset right  upper extremity and right lower extremity numbness, tingling, and weakness. Symptoms since resolved. Evaluate for stroke.  EXAM: MRI HEAD WITHOUT CONTRAST  MRA HEAD WITHOUT CONTRAST  TECHNIQUE: Multiplanar, multiecho pulse sequences of the brain and surrounding structures were obtained without intravenous contrast. Angiographic images of the head were obtained using MRA technique without contrast.  COMPARISON:  Head CT 09/28/2013  FINDINGS: MRI HEAD FINDINGS  There are a few subcentimeter foci of restricted diffusion in the posterior left frontal lobe, consistent with acute infarcts. Trace T2 hyperintensity is present in these locations. Small, scattered foci of T2 hyperintensity elsewhere in the cerebral white matter are nonspecific but compatible with minimal chronic small vessel ischemic disease. Ventricles and sulci are within normal limits for age. There is  no intracranial hemorrhage, mass, midline shift, or extra-axial fluid collection.  Orbits are unremarkable. Right maxillary sinus mucous retention cyst in mild left greater than right ethmoid air cell mucosal thickening are noted. Trace bilateral mastoid effusions are present. Major intracranial vascular flow voids are preserved.  MRA HEAD FINDINGS  There is mild motion artifact. Visualized distal vertebral arteries are patent with the left being slightly larger than the right. PICA origins and SCA origins are patent. Basilar artery is patent without stenosis. PCAs are unremarkable aside from mild branch vessel irregularity. There are patent posterior communicating arteries bilaterally.  There is eccentric narrowing of the distal petrous ICA on the left with approximately 50% luminal stenosis. No right-sided intracranial ICA stenosis is seen. Slightly bulbous appearance of the anterior right cavernous carotid is favored to reflect vessel tortuosity and motion, without a definite aneurysm identified. ACAs and MCAs are unremarkable aside from mild branch  vessel irregularity.  IMPRESSION: 1. Small, acute posterior left frontal lobe infarcts. 2. New major intracranial arterial occlusion. 3. 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   Electronically Signed   By: Logan Bores   On: 09/28/2013 20:54   Mr Mra Head/brain Wo Cm  09/28/2013   CLINICAL DATA:  Sudden onset right upper extremity and right lower extremity numbness, tingling, and weakness. Symptoms since resolved. Evaluate for stroke.  EXAM: MRI HEAD WITHOUT CONTRAST  MRA HEAD WITHOUT CONTRAST  TECHNIQUE: Multiplanar, multiecho pulse sequences of the brain and surrounding structures were obtained without intravenous contrast. Angiographic images of the head were obtained using MRA technique without contrast.  COMPARISON:  Head CT 09/28/2013  FINDINGS: MRI HEAD FINDINGS  There are a few subcentimeter foci of restricted diffusion in the posterior left frontal lobe, consistent with acute infarcts. Trace T2 hyperintensity is present in these locations. Small, scattered foci of T2 hyperintensity elsewhere in the cerebral white matter are nonspecific but compatible with minimal chronic small vessel ischemic disease. Ventricles and sulci are within normal limits for age. There is no intracranial hemorrhage, mass, midline shift, or extra-axial fluid collection.  Orbits are unremarkable. Right maxillary sinus mucous retention cyst in mild left greater than right ethmoid air cell mucosal thickening are noted. Trace bilateral mastoid effusions are present. Major intracranial vascular flow voids are preserved.  MRA HEAD FINDINGS  There is mild motion artifact. Visualized distal vertebral arteries are patent with the left being slightly larger than the right. PICA origins and SCA origins are patent. Basilar artery is patent without stenosis. PCAs are unremarkable aside from mild branch vessel irregularity. There are patent posterior communicating  arteries bilaterally.  There is eccentric narrowing of the distal petrous ICA on the left with approximately 50% luminal stenosis. No right-sided intracranial ICA stenosis is seen. Slightly bulbous appearance of the anterior right cavernous carotid is favored to reflect vessel tortuosity and motion, without a definite aneurysm identified. ACAs and MCAs are unremarkable aside from mild branch vessel irregularity.  IMPRESSION: 1. Small, acute posterior left frontal lobe infarcts. 2. New major intracranial arterial occlusion. 3. 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   Electronically Signed   By: Logan Bores   On: 09/28/2013 20:54    CBC  Recent Labs Lab 09/28/13 1325 09/28/13 1900  WBC 7.3 7.7  HGB 15.3 15.4  HCT 45.1 45.1  PLT 175 202  MCV 89.3 89.1  MCH 30.3 30.4  MCHC 33.9 34.1  RDW 11.7 11.8  LYMPHSABS 2.3  --   MONOABS 0.5  --   EOSABS 0.2  --   BASOSABS 0.0  --     Chemistries   Recent Labs Lab 09/28/13 1325 09/28/13 1900  NA 142  --   K 4.2  --   CL 104  --   CO2 22  --   GLUCOSE 141*  --   BUN 19  --   CREATININE 1.00 0.82  CALCIUM 10.1  --   AST 27  --   ALT 35  --   ALKPHOS 86  --   BILITOT 0.6  --    ------------------------------------------------------------------------------------------------------------------ estimated creatinine clearance is 140 ml/min (by C-G formula based on Cr of 0.82). ------------------------------------------------------------------------------------------------------------------ No results found for this basename: HGBA1C,  in the last 72 hours ------------------------------------------------------------------------------------------------------------------  Recent Labs  09/29/13 0128  CHOL 126  HDL 35*  LDLCALC 67  TRIG 118  CHOLHDL 3.6    ------------------------------------------------------------------------------------------------------------------ No results found for this basename: TSH, T4TOTAL, FREET3, T3FREE, THYROIDAB,  in the last 72 hours ------------------------------------------------------------------------------------------------------------------ No results found for this basename: VITAMINB12, FOLATE, FERRITIN, TIBC, IRON, RETICCTPCT,  in the last 72 hours  Coagulation profile No results found for this basename: INR, PROTIME,  in the last 168 hours  No results found for this basename: DDIMER,  in the last 72 hours  Cardiac Enzymes  Recent Labs Lab 09/28/13 1858 09/29/13 0128 09/29/13 0727  TROPONINI <0.30 <0.30 <0.30   ------------------------------------------------------------------------------------------------------------------ No components found with this basename: POCBNP,     Sherrey North D.O. on 09/29/2013 at 2:53 PM  Between 7am to 7pm - Pager - [phone]  After 7pm go to [email or web address] - password [email or web address]  And look for the night coverage person covering for me after hours  [facility] Office  [phone]

## 2013-09-29 NOTE — Progress Notes (Signed)
Occupational Therapy Evaluation Patient Details Name: Justin Sexton MRN: [id number] DOB: August 22, 1960 Today's Date: 09/29/2013    History of Present Illness 53 y.o. male admitted to [facility] on 09/28/13 with right sided weakness and numbness.  Stroke workup initiated and MRI revealed, "Small, acute posterior left frontal lobe infarcts."  He has significant PMhx of CABG, HTN, gout, and previous TIA   Clinical Impression   PTA pt lived at home with his wife and was independent with ADLs and functional mobility and worked in Architect. Pt performed vision and cognition screen and found results to be WNL. Educated pt on signs and symptoms of stroke. Acute OT to sign off. Encouraged pt to follow up with PCP if he experiences any vision changes.     Follow Up Recommendations  No OT follow up;Supervision/Assistance - 24 hour    Equipment Recommendations  None recommended by OT       Precautions / Restrictions Precautions Precautions: None      Mobility Bed Mobility               General bed mobility comments: Pt sitting in vestibule with his wife when OT arrived for evaluation.   Transfers Overall transfer level: Independent Equipment used: None                  Balance Overall balance assessment: No apparent balance deficits (not formally assessed)                                          ADL Overall ADL's : Modified independent                                       General ADL Comments: Educated pt on fall prevention and energy conservation. Reviewed signs and symptoms of stroke with pt and wife with pt teachback. Educated pt on stress management techniques and individualized risk factors for stroke.      Vision Eye Alignment: Within Functional Limits Alignment/Gaze Preference: Within Defined Limits Ocular Range of Motion: Within Functional Limits Tracking/Visual Pursuits: Able to track stimulus in all quads without  difficulty Saccades: Within functional limits Convergence: Within functional limits     Additional Comments: No nystagmus noted; all vision screen appeared to be WNL. Pt demonstrated hand eye coordination with ball tasks (tossing up and catching, tossing with OT, toss/catch while counting, etc). Pt reports that earlier during PT session he felt symptomatic of low BP and had slight dizziness following balance testing and feels this may have contributed to vision concerns. Pt notes yesterday he had floaters in his R eye which resolved and he has not experienced this again.    Perception Perception Perception Tested?: No   Praxis Praxis Praxis tested?: Within functional limits    Pertinent Vitals/Pain Pain Assessment: No/denies pain     Hand Dominance Right   Extremity/Trunk Assessment Upper Extremity Assessment Upper Extremity Assessment: LUE deficits/detail;Overall [facility] for tasks assessed LUE Deficits / Details: only of note, pt reports left shoulder pain above 90 degrees.  He reports he was supposed to have a L shoulder MRI today.    Lower Extremity Assessment Lower Extremity Assessment: Overall WFL for tasks assessed   Cervical / Trunk Assessment Cervical / Trunk Assessment: Normal   Communication Communication Communication: No difficulties   Cognition Arousal/Alertness:  Awake/alert Behavior During Therapy: WFL for tasks assessed/performed Overall Cognitive Status: Within Functional Limits for tasks assessed                                Home Living Family/patient expects to be discharged to:: Private residence Living Arrangements: Spouse/significant other Available Help at Discharge: Family;Available 24 hours/day Type of Home: House Home Access: Stairs to enter [employer] of Steps: 6 Entrance Stairs-Rails: Can reach both;Right;Left Home Layout: One level               Home Equipment: Walker - 2 wheels;Cane - quad;Cane - single  point;Shower seat;Bedside commode   Additional Comments: Pt was scheduled to check Lt shoulder RTC issues today. Will follow up.       Prior Functioning/Environment Level of Independence: Independent        Comments: pt works in Pharmacologist Communication: Other (comment) (OT eval complete)  Activity Tolerance: Patient tolerated treatment well Patient left: Other (comment) (Sitting with his wife in the vestibule on 4th floor. )   Time: 2633-3545 OT Time Calculation (min): 12 min Charges:  OT General Charges $OT Visit: 1 Procedure OT Evaluation $Initial OT Evaluation Tier I: 1 Procedure  Juluis Rainier [phone] 09/29/2013, 6:20 PM

## 2013-09-29 NOTE — Progress Notes (Signed)
  Echocardiogram 2D Echocardiogram has been performed.  Justin Sexton FRANCES 09/29/2013, 4:07 PM

## 2013-09-29 NOTE — Consult Note (Signed)
Referring Physician: Ree Kida    Chief Complaint: stroke  HPI:                                                                                                                                         Justin Sexton is an 53 y.o. male who was at work when he suddenly noted right arm that became right arm and leg weakness.  Patient sat down due to increased right sided weakness. He did not fall or have numbness or tingling of right arm or leg. HE was brought to [facility] hospital for further evaluation of symptoms.  At time of arrival to ED his symptoms had almost fully resolved.  Within one hour his symptoms had fully resolved. Currently he is back to his baseline.   Date last known well: Date: 09/28/2013 Time last known well: Unable to determine tPA Given: No: symptoms resolved.   Past Medical History  Diagnosis Date  . Hypertension   . H/O coronary artery bypass surgery     16 years ago, [place]  . TIA (transient ischemic attack)   . Hyperlipidemia   . Gout     Past Surgical History  Procedure Laterality Date  . Colonoscopy N/A 09/03/2012    Procedure: COLONOSCOPY;  Surgeon: Rogene Houston, MD;  Location: [facility];  Service: Endoscopy;  Laterality: N/A;  1200  . Coronary artery bypass graft      Family History  Problem Relation Age of Onset  . Hypertension Mother   . Hypertension Father    Social History:  reports that he has quit smoking. He does not have any smokeless tobacco history on file. He reports that he does not drink alcohol or use illicit drugs.  Allergies:  Allergies  Allergen Reactions  . Codeine Nausea And Vomiting    Medications:                                                                                                                           Prior to Admission:  Prescriptions prior to admission  Medication Sig Dispense Refill  . aspirin EC 81 MG tablet Take 162 mg by mouth daily.      . calcium-vitamin D (OSCAL WITH D) 500-200 MG-UNIT per tablet  Take 1 tablet by mouth daily.      Marland Kitchen ezetimibe (ZETIA) 10 MG tablet Take 10  mg by mouth daily.      . fish oil-omega-3 fatty acids 1000 MG capsule Take 1 g by mouth daily.      Marland Kitchen levocetirizine (XYZAL) 5 MG tablet Take 5 mg by mouth daily.      . metoprolol (LOPRESSOR) 50 MG tablet Take 25 mg by mouth 2 (two) times daily.      Marland Kitchen NITROSTAT 0.4 MG SL tablet Place 0.4 mg under the tongue every 5 (five) minutes as needed for chest pain.       . rosuvastatin (CRESTOR) 20 MG tablet Take 20 mg by mouth daily.       Scheduled: . aspirin  325 mg Oral Daily  . atorvastatin  40 mg Oral q1800  . enoxaparin (LOVENOX) injection  30 mg Subcutaneous Q24H  . ezetimibe  10 mg Oral Daily  . loratadine  10 mg Oral QHS  . metoprolol  25 mg Oral BID    ROS:                                                                                                                                       History obtained from the patient  General ROS: negative for - chills, fatigue, fever, night sweats, weight gain or weight loss Psychological ROS: negative for - behavioral disorder, hallucinations, memory difficulties, mood swings or suicidal ideation Ophthalmic ROS: negative for - blurry vision, double vision, eye pain or loss of vision ENT ROS: negative for - epistaxis, nasal discharge, oral lesions, sore throat, tinnitus or vertigo Allergy and Immunology ROS: negative for - hives or itchy/watery eyes Hematological and Lymphatic ROS: negative for - bleeding problems, bruising or swollen lymph nodes Endocrine ROS: negative for - galactorrhea, hair pattern changes, polydipsia/polyuria or temperature intolerance Respiratory ROS: negative for - cough, hemoptysis, shortness of breath or wheezing Cardiovascular ROS: negative for - chest pain, dyspnea on exertion, edema or irregular heartbeat Gastrointestinal ROS: negative for - abdominal pain, diarrhea, hematemesis, nausea/vomiting or stool incontinence Genito-Urinary ROS:  negative for - dysuria, hematuria, incontinence or urinary frequency/urgency Musculoskeletal ROS: negative for - joint swelling or muscular weakness Neurological ROS: as noted in HPI Dermatological ROS: negative for rash and skin lesion changes  Neurologic Examination:                                                                                                      Blood pressure 103/54, pulse 64, temperature 97.6 F (36.4 C), temperature source Oral, resp. rate 20, height 5'  10" (1.778 m), weight 125.193 kg (276 lb), SpO2 98.00%.   General: NAD Mental Status: Alert, oriented, thought content appropriate.  Speech fluent without evidence of aphasia.  Able to follow 3 step commands without difficulty. Cranial Nerves: II: Discs flat bilaterally; Visual fields grossly normal, pupils equal, round, reactive to light and accommodation III,IV, VI: ptosis not present, extra-ocular motions intact bilaterally V,VII: smile symmetric, facial light touch sensation normal bilaterally VIII: hearing normal bilaterally IX,X: gag reflex present XI: bilateral shoulder shrug XII: midline tongue extension without atrophy or fasciculations  Motor: Right : Upper extremity   5/5    Left:     Upper extremity   5/5  Lower extremity   5/5     Lower extremity   5/5 Tone and bulk:normal tone throughout; no atrophy noted Sensory: Pinprick and light touch intact throughout, bilaterally Deep Tendon Reflexes:  Right: Upper Extremity   Left: Upper extremity   biceps (C-5 to C-6) 2/4   biceps (C-5 to C-6) 2/4 tricep (C7) 2/4    triceps (C7) 2/4 Brachioradialis (C6) 2/4  Brachioradialis (C6) 2/4  Lower Extremity Lower Extremity  quadriceps (L-2 to L-4) 2/4   quadriceps (L-2 to L-4) 2/4 Achilles (S1) 2/4   Achilles (S1) 2/4  Plantars: Right: downgoing   Left: downgoing Cerebellar: normal finger-to-nose,  normal heel-to-shin test Gait: normal CV: pulses palpable throughout    Lab Results: Basic Metabolic  Panel:  Recent Labs Lab 09/28/13 1325 09/28/13 1900  NA 142  --   K 4.2  --   CL 104  --   CO2 22  --   GLUCOSE 141*  --   BUN 19  --   CREATININE 1.00 0.82  CALCIUM 10.1  --     Liver Function Tests:  Recent Labs Lab 09/28/13 1325  AST 27  ALT 35  ALKPHOS 86  BILITOT 0.6  PROT 8.0  ALBUMIN 4.5   No results found for this basename: LIPASE, AMYLASE,  in the last 168 hours No results found for this basename: AMMONIA,  in the last 168 hours  CBC:  Recent Labs Lab 09/28/13 1325 09/28/13 1900  WBC 7.3 7.7  NEUTROABS 4.3  --   HGB 15.3 15.4  HCT 45.1 45.1  MCV 89.3 89.1  PLT 175 202    Cardiac Enzymes:  Recent Labs Lab 09/28/13 1325 09/28/13 1858 09/29/13 0128 09/29/13 0727  TROPONINI <0.30 <0.30 <0.30 <0.30    Lipid Panel:  Recent Labs Lab 09/29/13 0128  CHOL 126  TRIG 118  HDL 35*  CHOLHDL 3.6  VLDL 24  LDLCALC 67    CBG:  Recent Labs Lab 09/28/13 1322  GLUCAP 126*    Microbiology: No results found for this or any previous visit.  Coagulation Studies: No results found for this basename: LABPROT, INR,  in the last 72 hours  Imaging: Dg Chest 2 View  09/28/2013   CLINICAL DATA:  Stroke symptoms ; history of CABG and hypertension  EXAM: CHEST  2 VIEW  COMPARISON:  None.  FINDINGS: The lungs are adequately inflated and clear. The heart and mediastinal structures exhibit no acute abnormalities. 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 The bony thorax exhibits no acute abnormality.  IMPRESSION: There is no evidence of CHF nor pneumonia nor other acute cardiopulmonary abnormality.   Electronically Signed   By: David  Martinique   On: 09/28/2013 19:40   Ct Head Wo Contrast  09/28/2013   CLINICAL DATA:  Extremity weakness  EXAM:  CT HEAD WITHOUT CONTRAST  TECHNIQUE: Contiguous axial images were obtained from the base of the skull through the vertex without intravenous contrast.  COMPARISON:  None.  FINDINGS: There is no  acute intracranial hemorrhage or infarct. No mass lesion or midline shift. Gray-white matter differentiation is well maintained. Ventricles are normal in size without evidence of hydrocephalus. CSF containing spaces are within normal limits. No extra-axial fluid collection.  The calvarium is intact.  Orbital soft tissues are within normal limits.  The paranasal sinuses and mastoid air cells are well pneumatized and free of fluid.  Scalp soft tissues are unremarkable.  IMPRESSION: Normal head CT with no acute intracranial process identified.   Electronically Signed   By: Jeannine Boga M.D.   On: 09/28/2013 13:44   Mr Brain Wo Contrast  09/28/2013   CLINICAL DATA:  Sudden onset right upper extremity and right lower extremity numbness, tingling, and weakness. Symptoms since resolved. Evaluate for stroke.  EXAM: MRI HEAD WITHOUT CONTRAST  MRA HEAD WITHOUT CONTRAST  TECHNIQUE: Multiplanar, multiecho pulse sequences of the brain and surrounding structures were obtained without intravenous contrast. Angiographic images of the head were obtained using MRA technique without contrast.  COMPARISON:  Head CT 09/28/2013  FINDINGS: MRI HEAD FINDINGS  There are a few subcentimeter foci of restricted diffusion in the posterior left frontal lobe, consistent with acute infarcts. Trace T2 hyperintensity is present in these locations. Small, scattered foci of T2 hyperintensity elsewhere in the cerebral white matter are nonspecific but compatible with minimal chronic small vessel ischemic disease. Ventricles and sulci are within normal limits for age. There is no intracranial hemorrhage, mass, midline shift, or extra-axial fluid collection.  Orbits are unremarkable. Right maxillary sinus mucous retention cyst in mild left greater than right ethmoid air cell mucosal thickening are noted. Trace bilateral mastoid effusions are present. Major intracranial vascular flow voids are preserved.  MRA HEAD FINDINGS  There is mild motion  artifact. Visualized distal vertebral arteries are patent with the left being slightly larger than the right. PICA origins and SCA origins are patent. Basilar artery is patent without stenosis. PCAs are unremarkable aside from mild branch vessel irregularity. There are patent posterior communicating arteries bilaterally.  There is eccentric narrowing of the distal petrous ICA on the left with approximately 50% luminal stenosis. No right-sided intracranial ICA stenosis is seen. Slightly bulbous appearance of the anterior right cavernous carotid is favored to reflect vessel tortuosity and motion, without a definite aneurysm identified. ACAs and MCAs are unremarkable aside from mild branch vessel irregularity.  IMPRESSION: 1. Small, acute posterior left frontal lobe infarcts. 2. New major intracranial arterial occlusion. 3. 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   Electronically Signed   By: Logan Bores   On: 09/28/2013 20:54   Mr Mra Head/brain Wo Cm  09/28/2013   CLINICAL DATA:  Sudden onset right upper extremity and right lower extremity numbness, tingling, and weakness. Symptoms since resolved. Evaluate for stroke.  EXAM: MRI HEAD WITHOUT CONTRAST  MRA HEAD WITHOUT CONTRAST  TECHNIQUE: Multiplanar, multiecho pulse sequences of the brain and surrounding structures were obtained without intravenous contrast. Angiographic images of the head were obtained using MRA technique without contrast.  COMPARISON:  Head CT 09/28/2013  FINDINGS: MRI HEAD FINDINGS  There are a few subcentimeter foci of restricted diffusion in the posterior left frontal lobe, consistent with acute infarcts. Trace T2 hyperintensity is present in these locations. Small, scattered foci  of T2 hyperintensity elsewhere in the cerebral white matter are nonspecific but compatible with minimal chronic small vessel ischemic disease. Ventricles and sulci are within normal limits for  age. There is no intracranial hemorrhage, mass, midline shift, or extra-axial fluid collection.  Orbits are unremarkable. Right maxillary sinus mucous retention cyst in mild left greater than right ethmoid air cell mucosal thickening are noted. Trace bilateral mastoid effusions are present. Major intracranial vascular flow voids are preserved.  MRA HEAD FINDINGS  There is mild motion artifact. Visualized distal vertebral arteries are patent with the left being slightly larger than the right. PICA origins and SCA origins are patent. Basilar artery is patent without stenosis. PCAs are unremarkable aside from mild branch vessel irregularity. There are patent posterior communicating arteries bilaterally.  There is eccentric narrowing of the distal petrous ICA on the left with approximately 50% luminal stenosis. No right-sided intracranial ICA stenosis is seen. Slightly bulbous appearance of the anterior right cavernous carotid is favored to reflect vessel tortuosity and motion, without a definite aneurysm identified. ACAs and MCAs are unremarkable aside from mild branch vessel irregularity.  IMPRESSION: 1. Small, acute posterior left frontal lobe infarcts. 2. New major intracranial arterial occlusion. 3. 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   Electronically Signed   By: Logan Bores   On: 09/28/2013 20:54    Vascular Ultrasound  Carotid Duplex (Doppler) has been completed.  Findings suggest upper range 1-39% right internal carotid artery stenosis and 40-59% left internal carotid artery stenosis. Vertebral arteries are patent with antegrade flow.  LDL 67  Assessment and plan discussed with with attending physician and they are in agreement.    Etta Quill PA-C [facility] [phone]  09/29/2013, 4:00 PM   Assessment: 53 y.o. male with transient right arm and leg weakness which resolved within one hour. MRI reveals  small acute posterior left frontal lobe infarcts with 50% stenosis of left petrous ICA and left ICA stenosis 40-59%.  Further evaluation of head and neck vessels to confirm stenosis would be beneficial.   Stroke Risk Factors - hyperlipidemia and hypertension  Recommend: 1) Continue ASA 325 and change to Plavix on discharge 2) CTA head and neck to further evaluate extra and intracranial stenosis 3) Echo (pending) 4) A1c (pending) 5) Stroke team to follow.    Patient seen and examined together with physician assistant and I concur with the assessment and plan.  Dorian Pod, MD

## 2013-09-29 NOTE — Progress Notes (Signed)
*  PRELIMINARY RESULTS* Vascular Ultrasound Carotid Duplex (Doppler) has been completed.  Findings suggest upper range 1-39% right internal carotid artery stenosis and 40-59% left internal carotid artery stenosis. Vertebral arteries are patent with antegrade flow.  09/29/2013 12:31 PM Maudry Mayhew, RVT, RDCS, RDMS

## 2013-09-29 NOTE — Progress Notes (Signed)
OT Cancellation Note  Patient Details Name: Justin Sexton MRN: [id number] DOB: [birth year]   Cancelled Treatment:    Reason Eval/Treat Not Completed: Patient at procedure or test/ unavailable  Benito Mccreedy OTR/L [phone] 09/29/2013, 3:01 PM

## 2013-09-30 MED ORDER — CLOPIDOGREL BISULFATE 75 MG PO TABS: 75.0000 mg | ORAL_TABLET | Freq: Every day | ORAL | Status: DC

## 2013-09-30 NOTE — Progress Notes (Signed)
SLP Cancellation Note  Patient Details Name: Justin Sexton MRN: [id number] DOB: 1961/02/06   Cancelled treatment:       Reason Eval/Treat Not Completed: SLP screened, no needs identified, will sign off. Pt and wife both report that he is at his baseline level of functioning, and that he did not experience any cognitive-linguistic changes.    Germain Osgood, M.A. CCC-SLP [phone]  Germain Osgood 09/30/2013, 1:31 PM

## 2013-09-30 NOTE — ED Provider Notes (Signed)
Medical screening examination/treatment/procedure(s) were conducted as a shared visit with non-physician practitioner(s) and myself.  I personally evaluated the patient during the encounter.   EKG Interpretation   Date/Time:  Tuesday September 28 2013 13:28:13 EDT Ventricular Rate:  71 PR Interval:  150 QRS Duration: 90 QT Interval:  392 QTC Calculation: 425 R Axis:   28 Text Interpretation:  Normal sinus rhythm Low voltage QRS Cannot rule out  Anterior infarct , age undetermined Abnormal ECG No previous tracing  Confirmed by Ayjah Show  MD, Nikea Settle [phone]) on 09/28/2013 1:30:53 PM      Patient presented to the ER for evaluation of acute onset right arm and leg numbness and weakness. Symptoms resolved after approximately one hour. He has no residual abnormality. This is suspicious for TIA. Patient reports a previous TIA 12 years ago, has no headache, no workup since. We'll recommend admission for further management of TIA versus CVA.  Orpah Greek, MD 09/30/13 [phone]

## 2013-09-30 NOTE — Progress Notes (Addendum)
Stroke Team Progress Note  HISTORY Justin Sexton is an 53 y.o. male who was at work 09/28/2013 when he suddenly noted right arm that became right arm and leg weakness. Patient sat down due to increased right sided weakness. He did not fall or have numbness or tingling of right arm or leg. HE was brought to [facility] hospital that day for further evaluation of symptoms. At time of arrival to ED his symptoms had almost fully resolved. Within one hour his symptoms had fully resolved. He is back to his baseline. Last known well 09/28/2013, time unknown. Patient was not administered TPA secondary to symptoms resolved, time last well unknown. He was admitted for further evaluation and treatment.  SUBJECTIVE His wife is at the bedside.  Overall he feels his condition is completely resolved. He denies prior history of strokes or TIAs but does have multiple vascular risk factors of obesity, hypertension, hyperlipidemia  OBJECTIVE Most recent Vital Signs: Filed Vitals:   09/29/13 2237 09/30/13 0201 09/30/13 0500 09/30/13 1044  BP: 136/67 115/71 112/62 115/75  Pulse: 67 56 55 85  Temp: 97.9 F (36.6 C) 97.5 F (36.4 C) 97.2 F (36.2 C) 97.3 F (36.3 C)  TempSrc: Oral Oral Oral Oral  Resp: 18 18 18 18   Height:      Weight:      SpO2: 97% 96% 96% 96%   CBG (last 3)   Recent Labs  09/28/13 1322  GLUCAP 126*    IV Fluid Intake:     MEDICATIONS  . aspirin  325 mg Oral Daily  . atorvastatin  40 mg Oral q1800  . enoxaparin (LOVENOX) injection  30 mg Subcutaneous Q24H  . ezetimibe  10 mg Oral Daily  . loratadine  10 mg Oral QHS  . metoprolol  25 mg Oral BID   PRN:  nitroGLYCERIN, senna-docusate  Diet:  Cardiac thin liquids Activity:  Bedrest DVT Prophylaxis:  Lovenox 30 mg sq daily   CLINICALLY SIGNIFICANT STUDIES Basic Metabolic Panel:  Recent Labs Lab 09/28/13 1325 09/28/13 1900  NA 142  --   K 4.2  --   CL 104  --   CO2 22  --   GLUCOSE 141*  --   BUN 19  --   CREATININE 1.00 0.82   CALCIUM 10.1  --    Liver Function Tests:  Recent Labs Lab 09/28/13 1325  AST 27  ALT 35  ALKPHOS 86  BILITOT 0.6  PROT 8.0  ALBUMIN 4.5   CBC:  Recent Labs Lab 09/28/13 1325 09/28/13 1900  WBC 7.3 7.7  NEUTROABS 4.3  --   HGB 15.3 15.4  HCT 45.1 45.1  MCV 89.3 89.1  PLT 175 202   Coagulation: No results found for this basename: LABPROT, INR,  in the last 168 hours Cardiac Enzymes:  Recent Labs Lab 09/28/13 1858 09/29/13 0128 09/29/13 0727  TROPONINI <0.30 <0.30 <0.30   Urinalysis: No results found for this basename: COLORURINE, APPERANCEUR, LABSPEC, PHURINE, GLUCOSEU, HGBUR, BILIRUBINUR, KETONESUR, PROTEINUR, UROBILINOGEN, NITRITE, LEUKOCYTESUR,  in the last 168 hours Lipid Panel    Component Value Date/Time   CHOL 126 09/29/2013 0128   TRIG 118 09/29/2013 0128   HDL 35* 09/29/2013 0128   CHOLHDL 3.6 09/29/2013 0128   VLDL 24 09/29/2013 0128   LDLCALC 67 09/29/2013 0128   HgbA1C  Lab Results  Component Value Date   HGBA1C 5.9* 09/29/2013    Urine Drug Screen:   No results found for this basename: labopia, cocainscrnur, labbenz,  amphetmu, thcu, labbarb    Alcohol Level: No results found for this basename: ETH,  in the last 168 hours   CT of the brain  09/28/2013    Normal head CT with no acute intracranial process identified.   CT Angio Head & Neck  09/29/2013    1. Irregular calcified and noncalcified plaque within the proximal left internal carotid artery results an an irregular stenosis of slightly greater than 50%. 2. Smooth narrowing of the proximal right internal carotid artery without significant stenosis. 3. Mild atherosclerotic irregularity within the petrous left internal carotid artery without a significant stenosis. 4. Mild distal small vessel disease bilaterally. 5. No significant proximal stenosis, aneurysm, or branch vessel occlusion within the circle of Willis. 6. The high posterior left frontal lobe infarct is visualized with the aid of the prior  MRI. There is no significant interval change.    MRI of the brain  09/28/2013    Small, acute posterior left frontal lobe infarcts.  MRA of the brain  09/28/2013    New major intracranial arterial occlusion. 3. 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    Carotid Doppler  Findings suggest upper range 1-39% right internal carotid artery stenosis and 40-59% left internal carotid artery stenosis. Vertebral arteries are patent with antegrade flow.  2D Echocardiogram  EF 55-60% with no source of embolus. Normal biventricular size and function. Impaired relaxation. Normal filling pressures. No significant valvular abnormalities. PASP can&'t be assessed on current study.  CXR  09/28/2013   There is no evidence of CHF nor pneumonia nor other acute cardiopulmonary abnormality.     EKG  normal sinus rhythm. For complete results please see formal report.   Therapy Recommendations no OT  Physical Exam   Pleasant obese middle aged Caucasian male not in distress.Awake alert. Afebrile. Head is nontraumatic. Neck is supple without bruit. Hearing is normal. Cardiac exam no murmur or gallop. Lungs are clear to auscultation. Distal pulses are well felt. Neurological Exam ;  Awake  Alert oriented x 3. Normal speech and language.eye movements full without nystagmus.fundi were not visualized. Vision acuity and fields appear normal. Hearing is normal. Palatal movements are normal. Face symmetric. Tongue midline. Normal strength, tone, reflexes and coordination. Normal sensation. Gait deferred. ASSESSMENT Mr. Justin Sexton is a 53 y.o. male presenting with right arm and leg weakness which resolved.  Imaging confirms a left parietal infarct. Infarct felt to be embolic secondary to unknown source.  On aspirin 81 mg orally every day prior to admission. Now on aspirin 325 mg orally every day for secondary stroke prevention. Patient with no resultant  neuro deficits. Stroke work up underway.   Hypertension Hyperlipidemia, LDL 67, on zetia, fish oil and crestor PTA, now on lipitor 40, zetia , goal LDL < 100  H/o CABG  TIA  HgbA2c 5.9  R ICA stenosis of 50%, could be possible related, medical management, surgical intervention not indicated  Hospital day # 2  TREATMENT/PLAN  Change aspirin to clopidogrel 75 mg orally every day for secondary stroke prevention. TEE to look for embolic source. Arranged with [place] for tomorrow.  If positive for PFO (patent foramen ovale), check bilateral lower extremity venous dopplers to rule out DVT as possible source of stroke. (I have made patient NPO after midnight tonight).  If TEE negative, a [place] electrophysiologist will consult and consider placement of an implantable loop  recorder to evaluate for atrial fibrillation as etiology of stroke. This has been explained to patient/family by Dr. Leonie Man and they are agreeable. Anticipate discharge tomorrow   Burnetta Sabin, MSN, RN, ANVP-BC, ANP-BC, [facility] Pager: [phone] 09/30/2013 11:10 AM I have personally examined this patient, reviewed notes, independently viewed imaging studies, participated in medical decision making and plan of care. I have made any additions or clarifications directly to the above note. Agree with note above. The patient is at significant risk for recurrent strokes/TIAs and needs risk stratification workup and aggressive risk factor control to lower his chances of future strokes. I had a long discussion with the patient and his wife about this and answered questions  Antony Contras, MD Medical Director [place] Pager: [phone] 09/30/2013 3:01 PM     To contact Stroke Continuity provider, please refer to [email or web address] After hours, contact General Neurology

## 2013-09-30 NOTE — Progress Notes (Signed)
[facility]                                                                              Patient Demographics  Justin Sexton, is a 53 y.o. male, DOB - 08-26-1960, GUR:[id number]  Admit date - 09/28/2013   Admitting Physician Orson Eva, MD  Outpatient Primary MD for the patient is VYAS,DHRUV B., MD  LOS - 2   Chief Complaint  Patient presents with  . Extremity Weakness      HPI on 09/28/2013 This is a 53 y.o. year old male with significant past medical history of HLD, HTN, CAD s/p CABG around 1999, prior TIA presenting with TIA. Patient stated that while at work, he had sudden onset of R sided hand and leg numbness/weakness. His symptoms persisted for almost 1 hour and then resolved. Reported some mild dizziness, but no syncope, falls. No CP, SOB, nausea. Tried to dial on the phone with his R hand, but was unable to do so over the course of the hour. Patient was brought to [facility] for further evaluation. Workup essentially negative including Head CT, CBC, BMET. Trop, EKG NSR. Patient admitted for TIA workup. Stated that he takes 2 baby ASA daily.    Assessment & Plan   Acute CVA -Right-sided numbness and weakness resolved -CT head showed normal head CT with no acute intracranial process identified. 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 Vertebral arteries are patent with antegrade flow. -Echocardiogram: EF 28-31%, Grade 1 diastolic dysfunction -Hemoglobin A1c 5.9, LDL 67 -Continue aspirin -Neurology consulted and appreciated -PT and OT consulted and patient does not require further treatment -neurology feels this to be embolic in nature and would like patient to have a TEE and/or loop recorder placed  Hypertension -Stable, continue metoprolol  Coronary artery  disease -Currently chest pain-free, troponin negative -Continue metoprolol and aspirin  Hyperlipidemia -LDL 67, continue Lipitor and zetia  Code Status: Full  Family Communication: Wife at bedside  Disposition Plan: Admitted, will change to inpatient.  Pending TEE/loop recorder  Time Spent in minutes   30 minutes  Procedures  Carotid doppler: 1-39% right internal carotid artery stenosis and 40-59% left internal carotid artery stenosis. Vertebral arteries are patent with antegrade flow.  Echocardiogram Study Conclusions - Left ventricle: The cavity size was normal. There was mild concentric hypertrophy. Systolic function was normal. The estimated ejection fraction was in the range of 55% to 60%. Wall motion was normal; there were no regional wall motion abnormalities. Doppler parameters are consistent with abnormal left ventricular relaxation (grade 1 diastolic dysfunction).  - Aortic valve: Trileaflet; normal thickness leaflets. There was no regurgitation. - Aortic root: The aortic root was normal in size. - Mitral valve: Structurally normal valve. There was no regurgitation. - Left atrium: The atrium was moderately dilated. - Right ventricle: Systolic function was normal. - Pulmonic valve: There was mild regurgitation. - Pericardium, extracardiac: There was no pericardial effusion. Impressions:  Normal biventricular size and function. Impaired relaxation. Normal filling pressures. No significant valvular abnormalities. PASP can&'t  be assessed on current study.  Consults   Neurology  DVT Prophylaxis  Lovenox  Lab Results  Component Value Date   PLT 202 09/28/2013    Medications  Scheduled Meds: . aspirin  325 mg Oral Daily  . atorvastatin  40 mg Oral q1800  . enoxaparin (LOVENOX) injection  30 mg Subcutaneous Q24H  . ezetimibe  10 mg Oral Daily  . loratadine  10 mg Oral QHS  . metoprolol  25 mg Oral BID   Continuous Infusions:  PRN Meds:.nitroGLYCERIN,  senna-docusate  Antibiotics    Anti-infectives   None      Subjective:   Justin Sexton seen and examined today.  Patient has no complaints this morning.  He denies dizziness, headache, chest pain or shortness of breath.  Objective:   Filed Vitals:   09/29/13 2237 09/30/13 0201 09/30/13 0500 09/30/13 1044  BP: 136/67 115/71 112/62 115/75  Pulse: 67 56 55 85  Temp: 97.9 F (36.6 C) 97.5 F (36.4 C) 97.2 F (36.2 C) 97.3 F (36.3 C)  TempSrc: Oral Oral Oral Oral  Resp: 18 18 18 18   Height:      Weight:      SpO2: 97% 96% 96% 96%    Wt Readings from Last 3 Encounters:  09/28/13 125.193 kg (276 lb)  09/03/12 120.203 kg (265 lb)  09/03/12 120.203 kg (265 lb)     Intake/Output Summary (Last 24 hours) at 09/30/13 1156 Last data filed at 09/30/13 0900  Gross per 24 hour  Intake    360 ml  Output      0 ml  Net    360 ml    Exam  General: Well developed, well nourished, NAD, appears stated age  81: NCAT, mucous membranes moist.   Cardiovascular: S1 S2 auscultated, Regular rate and rhythm.  Respiratory: Clear to auscultation bilaterally with equal chest rise  Abdomen: Soft, nontender, nondistended, + bowel sounds  Extremities: warm dry without cyanosis clubbing or edema  Neuro: AAOx3, no focal deficits  Psych: Normal affect and demeanor with intact judgement and insight  Data Review   Micro Results No results found for this or any previous visit (from the past 240 hour(s)).  Radiology Reports Dg Chest 2 View  09/28/2013   CLINICAL DATA:  Stroke symptoms ; history of CABG and hypertension  EXAM: CHEST  2 VIEW  COMPARISON:  None.  FINDINGS: The lungs are adequately inflated and clear. The heart and mediastinal structures exhibit no acute abnormalities. 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 The bony thorax exhibits no acute abnormality.  IMPRESSION: There is no evidence of CHF nor pneumonia nor other acute cardiopulmonary  abnormality.   Electronically Signed   By: David  Martinique   On: 09/28/2013 19:40   Ct Head Wo Contrast  09/28/2013   CLINICAL DATA:  Extremity weakness  EXAM: CT HEAD WITHOUT CONTRAST  TECHNIQUE: Contiguous axial images were obtained from the base of the skull through the vertex without intravenous contrast.  COMPARISON:  None.  FINDINGS: There is no acute intracranial hemorrhage or infarct. No mass lesion or midline shift. Gray-white matter differentiation is well maintained. Ventricles are normal in size without evidence of hydrocephalus. CSF containing spaces are within normal limits. No extra-axial fluid collection.  The calvarium is intact.  Orbital soft tissues are within normal limits.  The paranasal sinuses and mastoid air cells are well pneumatized and free of fluid.  Scalp soft tissues are unremarkable.  IMPRESSION: Normal head CT with  no acute intracranial process identified.   Electronically Signed   By: Jeannine Boga M.D.   On: 09/28/2013 13:44   Mr Brain Wo Contrast  09/28/2013   CLINICAL DATA:  Sudden onset right upper extremity and right lower extremity numbness, tingling, and weakness. Symptoms since resolved. Evaluate for stroke.  EXAM: MRI HEAD WITHOUT CONTRAST  MRA HEAD WITHOUT CONTRAST  TECHNIQUE: Multiplanar, multiecho pulse sequences of the brain and surrounding structures were obtained without intravenous contrast. Angiographic images of the head were obtained using MRA technique without contrast.  COMPARISON:  Head CT 09/28/2013  FINDINGS: MRI HEAD FINDINGS  There are a few subcentimeter foci of restricted diffusion in the posterior left frontal lobe, consistent with acute infarcts. Trace T2 hyperintensity is present in these locations. Small, scattered foci of T2 hyperintensity elsewhere in the cerebral white matter are nonspecific but compatible with minimal chronic small vessel ischemic disease. Ventricles and sulci are within normal limits for age. There is no intracranial  hemorrhage, mass, midline shift, or extra-axial fluid collection.  Orbits are unremarkable. Right maxillary sinus mucous retention cyst in mild left greater than right ethmoid air cell mucosal thickening are noted. Trace bilateral mastoid effusions are present. Major intracranial vascular flow voids are preserved.  MRA HEAD FINDINGS  There is mild motion artifact. Visualized distal vertebral arteries are patent with the left being slightly larger than the right. PICA origins and SCA origins are patent. Basilar artery is patent without stenosis. PCAs are unremarkable aside from mild branch vessel irregularity. There are patent posterior communicating arteries bilaterally.  There is eccentric narrowing of the distal petrous ICA on the left with approximately 50% luminal stenosis. No right-sided intracranial ICA stenosis is seen. Slightly bulbous appearance of the anterior right cavernous carotid is favored to reflect vessel tortuosity and motion, without a definite aneurysm identified. ACAs and MCAs are unremarkable aside from mild branch vessel irregularity.  IMPRESSION: 1. Small, acute posterior left frontal lobe infarcts. 2. New major intracranial arterial occlusion. 3. 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   Electronically Signed   By: Logan Bores   On: 09/28/2013 20:54   Mr Mra Head/brain Wo Cm  09/28/2013   CLINICAL DATA:  Sudden onset right upper extremity and right lower extremity numbness, tingling, and weakness. Symptoms since resolved. Evaluate for stroke.  EXAM: MRI HEAD WITHOUT CONTRAST  MRA HEAD WITHOUT CONTRAST  TECHNIQUE: Multiplanar, multiecho pulse sequences of the brain and surrounding structures were obtained without intravenous contrast. Angiographic images of the head were obtained using MRA technique without contrast.  COMPARISON:  Head CT 09/28/2013  FINDINGS: MRI HEAD FINDINGS  There are a few subcentimeter foci of  restricted diffusion in the posterior left frontal lobe, consistent with acute infarcts. Trace T2 hyperintensity is present in these locations. Small, scattered foci of T2 hyperintensity elsewhere in the cerebral white matter are nonspecific but compatible with minimal chronic small vessel ischemic disease. Ventricles and sulci are within normal limits for age. There is no intracranial hemorrhage, mass, midline shift, or extra-axial fluid collection.  Orbits are unremarkable. Right maxillary sinus mucous retention cyst in mild left greater than right ethmoid air cell mucosal thickening are noted. Trace bilateral mastoid effusions are present. Major intracranial vascular flow voids are preserved.  MRA HEAD FINDINGS  There is mild motion artifact. Visualized distal vertebral arteries are patent with the left being slightly larger than the right. PICA origins and SCA origins are patent.  Basilar artery is patent without stenosis. PCAs are unremarkable aside from mild branch vessel irregularity. There are patent posterior communicating arteries bilaterally.  There is eccentric narrowing of the distal petrous ICA on the left with approximately 50% luminal stenosis. No right-sided intracranial ICA stenosis is seen. Slightly bulbous appearance of the anterior right cavernous carotid is favored to reflect vessel tortuosity and motion, without a definite aneurysm identified. ACAs and MCAs are unremarkable aside from mild branch vessel irregularity.  IMPRESSION: 1. Small, acute posterior left frontal lobe infarcts. 2. New major intracranial arterial occlusion. 3. 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   Electronically Signed   By: Logan Bores   On: 09/28/2013 20:54    CBC  Recent Labs Lab 09/28/13 1325 09/28/13 1900  WBC 7.3 7.7  HGB 15.3 15.4  HCT 45.1 45.1  PLT 175 202  MCV 89.3 89.1  MCH 30.3 30.4  MCHC 33.9 34.1  RDW 11.7 11.8    LYMPHSABS 2.3  --   MONOABS 0.5  --   EOSABS 0.2  --   BASOSABS 0.0  --     Chemistries   Recent Labs Lab 09/28/13 1325 09/28/13 1900  NA 142  --   K 4.2  --   CL 104  --   CO2 22  --   GLUCOSE 141*  --   BUN 19  --   CREATININE 1.00 0.82  CALCIUM 10.1  --   AST 27  --   ALT 35  --   ALKPHOS 86  --   BILITOT 0.6  --    ------------------------------------------------------------------------------------------------------------------ estimated creatinine clearance is 140 ml/min (by C-G formula based on Cr of 0.82). ------------------------------------------------------------------------------------------------------------------  Recent Labs  09/29/13 0128  HGBA1C 5.9*   ------------------------------------------------------------------------------------------------------------------  Recent Labs  09/29/13 0128  CHOL 126  HDL 35*  LDLCALC 67  TRIG 118  CHOLHDL 3.6   ------------------------------------------------------------------------------------------------------------------ No results found for this basename: TSH, T4TOTAL, FREET3, T3FREE, THYROIDAB,  in the last 72 hours ------------------------------------------------------------------------------------------------------------------ No results found for this basename: VITAMINB12, FOLATE, FERRITIN, TIBC, IRON, RETICCTPCT,  in the last 72 hours  Coagulation profile No results found for this basename: INR, PROTIME,  in the last 168 hours  No results found for this basename: DDIMER,  in the last 72 hours  Cardiac Enzymes  Recent Labs Lab 09/28/13 1858 09/29/13 0128 09/29/13 0727  TROPONINI <0.30 <0.30 <0.30   ------------------------------------------------------------------------------------------------------------------ No components found with this basename: POCBNP,     Aliza Moret D.O. on 09/30/2013 at 11:56 AM  Between 7am to 7pm - Pager - [phone]  After 7pm go to [email or web address] -  password [email or web address]  And look for the night coverage person covering for me after hours  [facility] Office  [phone]

## 2013-10-01 ENCOUNTER — Encounter (HOSPITAL_COMMUNITY)
Admission: EM | Disposition: A | Discharge status: Home / Self Care | Payer: Self-pay | Source: Home / Self Care | Attending: Internal Medicine

## 2013-10-01 ENCOUNTER — Encounter (HOSPITAL_COMMUNITY): Payer: Self-pay

## 2013-10-01 DIAGNOSIS — I6350 Cerebral infarction due to unspecified occlusion or stenosis of unspecified cerebral artery: Secondary | ICD-10-CM

## 2013-10-01 HISTORY — PX: LOOP RECORDER IMPLANT: SHX5477

## 2013-10-01 HISTORY — PX: TEE WITHOUT CARDIOVERSION: SHX5443

## 2013-10-01 SURGERY — ECHOCARDIOGRAM, TRANSESOPHAGEAL
Anesthesia: Moderate Sedation

## 2013-10-01 SURGERY — LOOP RECORDER IMPLANT
Anesthesia: LOCAL

## 2013-10-01 MED ORDER — MIDAZOLAM HCL 10 MG/2ML IJ SOLN
INTRAMUSCULAR | Status: DC | PRN
  Administered 2013-10-01: 1 mg via INTRAVENOUS
  Administered 2013-10-01 (×2): 2 mg via INTRAVENOUS

## 2013-10-01 MED ORDER — FENTANYL CITRATE 0.05 MG/ML IJ SOLN
INTRAMUSCULAR | Status: DC | PRN
  Administered 2013-10-01 (×3): 25 ug via INTRAVENOUS

## 2013-10-01 MED ORDER — BUTAMBEN-TETRACAINE-BENZOCAINE 2-2-14 % EX AERO
INHALATION_SPRAY | CUTANEOUS | Status: DC | PRN
Start: 2013-10-01 — End: 2013-10-01
  Administered 2013-10-01: 2 via TOPICAL

## 2013-10-01 MED ORDER — LIDOCAINE-EPINEPHRINE 1 %-1:100000 IJ SOLN
INTRAMUSCULAR | Status: AC
  Filled 2013-10-01: qty 1

## 2013-10-01 MED ORDER — MIDAZOLAM HCL 5 MG/ML IJ SOLN
INTRAMUSCULAR | Status: AC
  Filled 2013-10-01: qty 2

## 2013-10-01 MED ORDER — SODIUM CHLORIDE 0.9 % IV SOLN: INTRAVENOUS | Status: DC

## 2013-10-01 MED ORDER — FENTANYL CITRATE 0.05 MG/ML IJ SOLN
INTRAMUSCULAR | Status: AC
  Filled 2013-10-01: qty 2

## 2013-10-01 MED ORDER — CLOPIDOGREL BISULFATE 75 MG PO TABS: 75.0000 mg | ORAL_TABLET | Freq: Every day | ORAL | Status: DC

## 2013-10-01 MED ORDER — DIPHENHYDRAMINE HCL 50 MG/ML IJ SOLN
INTRAMUSCULAR | Status: AC
Start: 2013-10-01 — End: 2013-10-01
  Filled 2013-10-01: qty 1

## 2013-10-01 NOTE — H&P (View-Only) (Signed)
ELECTROPHYSIOLOGY CONSULT NOTE  Patient ID: Justin Sexton MRN: [id number], DOB/AGE: 09/27/1960   Admit date: 09/28/2013 Date of Consult: 10/01/2013  Primary Physician: Glenda Chroman., MD Primary Cardiologist: new to [facility] Reason for Consultation: Cryptogenic stroke; recommendations regarding Implantable Loop Recorder  History of Present Illness Justin Sexton was admitted on 09/28/2013 with right arm and leg weakness.  Imaging demonstrated left parietal infarct.  He has undergone workup for stroke including echocardiogram and carotid dopplers.  The patient has been monitored on telemetry which has demonstrated sinus rhythm with no arrhythmias (per report - not currently on telemetry).  Inpatient stroke work-up is to be completed with a TEE.   Echocardiogram this admission demonstrated EF 55-60%, no RWMA, grade 1 diastolic dysfunction, LA 48.  Lab work is reviewed  Prior to admission, the patient denies chest pain, shortness of breath, dizziness, palpitations, or syncope.  They are recovering from their stroke with plans to return home at discharge.  EP has been asked to evaluate for placement of an implantable loop recorder to monitor for atrial fibrillation.  ROS is negative except as outlined above.    Past Medical History  Diagnosis Date  . Hypertension   . H/O coronary artery bypass surgery     16 years ago, [place]  . TIA (transient ischemic attack)   . Hyperlipidemia   . Gout      Surgical History:  Past Surgical History  Procedure Laterality Date  . Colonoscopy N/A 09/03/2012    Procedure: COLONOSCOPY;  Surgeon: Rogene Houston, MD;  Location: [facility];  Service: Endoscopy;  Laterality: N/A;  1200  . Coronary artery bypass graft       Prescriptions prior to admission  Medication Sig Dispense Refill  . aspirin EC 81 MG tablet Take 162 mg by mouth daily.      . calcium-vitamin D (OSCAL WITH D) 500-200 MG-UNIT per tablet Take 1 tablet by mouth daily.        Marland Kitchen ezetimibe (ZETIA) 10 MG tablet Take 10 mg by mouth daily.      . fish oil-omega-3 fatty acids 1000 MG capsule Take 1 g by mouth daily.      Marland Kitchen levocetirizine (XYZAL) 5 MG tablet Take 5 mg by mouth daily.      . metoprolol (LOPRESSOR) 50 MG tablet Take 25 mg by mouth 2 (two) times daily.      Marland Kitchen NITROSTAT 0.4 MG SL tablet Place 0.4 mg under the tongue every 5 (five) minutes as needed for chest pain.       . rosuvastatin (CRESTOR) 20 MG tablet Take 20 mg by mouth daily.        Inpatient Medications:  . aspirin  325 mg Oral Daily  . atorvastatin  40 mg Oral q1800  . clopidogrel  75 mg Oral Daily  . enoxaparin (LOVENOX) injection  30 mg Subcutaneous Q24H  . ezetimibe  10 mg Oral Daily  . loratadine  10 mg Oral QHS  . metoprolol  25 mg Oral BID    Allergies:  Allergies  Allergen Reactions  . Codeine Nausea And Vomiting    History   Social History  . Marital Status: Married    Spouse Name: N/A    Number of Children: N/A  . Years of Education: N/A   Occupational History  . Not on file.   Social History Main Topics  . Smoking status: Former Research scientist (life sciences)  . Smokeless tobacco: Not on file  . Alcohol  Use: No  . Drug Use: No  . Sexual Activity: Not on file   Other Topics Concern  . Not on file   Social History Narrative  . No narrative on file     Family History  Problem Relation Age of Onset  . Hypertension Mother   . Hypertension Father     BP 110/59  Pulse 63  Temp(Src) 97.3 F (36.3 C) (Oral)  Resp 18  Ht 5\' 10"  (1.778 m)  Wt 276 lb (125.193 kg)  BMI 39.60 kg/m2  SpO2 97%  Physical Exam: Physical Exam: Filed Vitals:   10/01/13 0146 10/01/13 0633 10/01/13 1020 10/01/13 1306  BP: 110/59  115/55 158/75  Pulse: 55 63 62 69  Temp: 97.7 F (36.5 C) 97.3 F (36.3 C) 98.1 F (36.7 C) 98.5 F (36.9 C)  TempSrc: Oral Oral Oral Oral  Resp: 18 18 20 16   Height:      Weight:      SpO2: 97% 97% 95% 94%    GEN- The patient is well appearing, alert and oriented x  3 today.   Head- normocephalic, atraumatic Eyes-  Sclera clear, conjunctiva pink Ears- hearing intact Oropharynx- clear Neck- supple, Lungs- Clear to ausculation bilaterally, normal work of breathing Heart- Regular rate and rhythm  GI- soft, NT, ND, + BS Extremities- no clubbing, cyanosis, or edema, groin is without hematoma/ bruit MS- no significant deformity or atrophy Skin- no rash or lesion Psych- euthymic mood, full affect   Labs:   Lab Results  Component Value Date   WBC 7.7 09/28/2013   HGB 15.4 09/28/2013   HCT 45.1 09/28/2013   MCV 89.1 09/28/2013   PLT 202 09/28/2013    Recent Labs Lab 09/28/13 1325 09/28/13 1900  NA 142  --   K 4.2  --   CL 104  --   CO2 22  --   BUN 19  --   CREATININE 1.00 0.82  CALCIUM 10.1  --   PROT 8.0  --   BILITOT 0.6  --   ALKPHOS 86  --   ALT 35  --   AST 27  --   GLUCOSE 141*  --      Radiology/Studies: Ct Angio Head W/cm &/or Wo Cm 09/29/2013   CLINICAL DATA:  Posterior left frontal lobe infarct. Atherosclerotic changes in the petrous left internal carotid artery.  EXAM: CT ANGIOGRAPHY HEAD AND NECK  TECHNIQUE: Multidetector CT imaging of the head and neck was performed using the standard protocol during bolus administration of intravenous contrast. Multiplanar CT image reconstructions and MIPs were obtained to evaluate the vascular anatomy. Carotid stenosis measurements (when applicable) are obtained utilizing NASCET criteria, using the distal internal carotid diameter as the denominator.  CONTRAST:  43mL OMNIPAQUE IOHEXOL 350 MG/ML SOLN  COMPARISON:  MRI brain and MRA head 09/28/2013  FINDINGS: CTA HEAD FINDINGS  The acute punctate nonhemorrhagic infarct in the high posterior left frontal lobe is stable. No additional acute infarcts are present. The ventricles are of normal size. No significant extraaxial fluid collection is present.  The paranasal sinuses and mastoid air cells are clear. The osseous skull is intact.  The postcontrast  images demonstrate no pathologic enhancement.  The narrowing of the petrous left internal carotid artery is confirmed. Atherosclerotic changes are present within the cavernous internal carotid arteries bilaterally without a significant stenosis. The A1 and M1 segments are normal. The anterior communicating artery is patent. The MCA bifurcations are intact. There is some attenuation of distal  ACA and MCA branch vessels. No focal stenosis or occlusion is evident to account for the acute infarct.  Atherosclerotic irregularities are present within the vertebral arteries without significant stenosis. The basilar artery is intact. Both posterior cerebral arteries originate from the basilar tip. There is some irregular attenuation of distal PCA branch vessels bilaterally.  Review of the MIP images confirms the above findings.  CTA NECK FINDINGS  A 3 vessel arch configuration is present.  The vertebral arteries originate from the subclavian arteries bilaterally. The dural opacification is somewhat suboptimal. There are no significant vertebral artery stenoses within the neck.  The right common carotid artery is within normal limits. Calcified and noncalcified atherosclerotic plaque narrowing of the proximal right internal carotid artery to 3.0 mm. This compares with 4.3 mm in the more distal vessel.  The left common carotid artery demonstrates minimal atherosclerotic calcification at its origin without significant stenosis. There is irregular calcified and noncalcified plaque within the proximal left internal carotid artery creating an eccentric filling defect. The minimal luminal diameter is 2.1 mm. This compares with a more distal vessel at 4.7 mm.  The soft tissues the neck are within normal limits. There is no significant adenopathy. The lung apices are clear.  Mild degenerative changes are present in the cervical spine with uncovertebral spurring at C5-6. Mild osseous foraminal narrowing is present on the right. A  focal sclerotic lesion is present within the left posterior aspect of C3 and extending into the pedicle. This appears benign.  Review of the MIP images confirms the above findings.  IMPRESSION: 1. Irregular calcified and noncalcified plaque within the proximal left internal carotid artery results an an irregular stenosis of slightly greater than 50%. 2. Smooth narrowing of the proximal right internal carotid artery without significant stenosis. 3. Mild atherosclerotic irregularity within the petrous left internal carotid artery without a significant stenosis. 4. Mild distal small vessel disease bilaterally. 5. No significant proximal stenosis, aneurysm, or branch vessel occlusion within the circle of Willis. 6. The high posterior left frontal lobe infarct is visualized with the aid of the prior MRI. There is no significant interval change.   Electronically Signed   By: Lawrence Santiago M.D.   On: 09/29/2013 21:36   Dg Chest 2 View 09/28/2013   CLINICAL DATA:  Stroke symptoms ; history of CABG and hypertension  EXAM: CHEST  2 VIEW  COMPARISON:  None.  FINDINGS: The lungs are adequately inflated and clear. The heart and mediastinal structures exhibit no acute abnormalities. 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 The bony thorax exhibits no acute abnormality.  IMPRESSION: There is no evidence of CHF nor pneumonia nor other acute cardiopulmonary abnormality.   Electronically Signed   By: David  Martinique   On: 09/28/2013 19:40    12-lead ECG sinus rhythm, rate 71, normal intervals  Telemetry sinus rhythm with no arrhythmias via review of saved strips  Assessment and Plan:  1. Cryptogenic stroke The patient presents with cryptogenic stroke.  The patient has a TEE planned for this AM.  I spoke at length with the patient about monitoring for afib with either a 30 day event monitor or an implantable loop recorder.  Risks, benefits, and alteratives to implantable loop recorder were discussed  with the patient today.   At this time, the patient is very clear in their decision to proceed with implantable loop recorder.   Please call with questions.

## 2013-10-01 NOTE — Interval H&P Note (Signed)
History and Physical Interval Note:  10/01/2013 1:17 PM  Justin Sexton  has presented today for surgery, with the diagnosis of stroke  The various methods of treatment have been discussed with the patient and family. After consideration of risks, benefits and other options for treatment, the patient has consented to  Procedure(s) with comments: TRANSESOPHAGEAL ECHOCARDIOGRAM (TEE) (N/A) - loop to follow as a surgical intervention .  The patient's history has been reviewed, patient examined, no change in status, stable for surgery.  I have reviewed the patient's chart and labs.  Questions were answered to the patient's satisfaction.     Marabeth Melland

## 2013-10-01 NOTE — CV Procedure (Signed)
SURGEON:  Thompson Grayer, MD     PREPROCEDURE DIAGNOSIS:  Cryptogenic Stroke    POSTPROCEDURE DIAGNOSIS:  Cryptogenic Stroke     PROCEDURES:   1. Implantable loop recorder implantation    INTRODUCTION:  Justin Sexton is a 53 y.o. male with a history of unexplained stroke who presents today for implantable loop implantation.  The patient has had a cryptogenic stroke.  Despite an extensive workup by neurology, no reversible causes have been identified.  he has worn telemetry during which he did not have arrhythmias.  There is significant concern for possible atrial fibrillation as the cause for the patients stroke.  The patient therefore presents today for implantable loop implantation.     DESCRIPTION OF PROCEDURE:  Informed written consent was obtained, and the patient was brought to the electrophysiology lab in a fasting state.  The patient required no sedation for the procedure today.  Mapping over the patient's chest was performed by the EP lab staff to identify the area where electrograms were most prominent for ILR recording.  This area was found to be the left parasternal region over the 3rd-4th intercostal space. The patients left chest was therefore prepped and draped in the usual sterile fashion by the EP lab staff. The skin overlying the left parasternal region was infiltrated with lidocaine for local analgesia.  A 0.5-cm incision was made over the left parasternal region over the 3rd intercostal space.  A subcutaneous ILR pocket was fashioned using a combination of sharp and blunt dissection.  A Medtronic Reveal [place] model [id number] implantable loop recorder was then placed into the pocket  R waves were very prominent and measured 0.14mV.  Steri- Strips and a sterile dressing were then applied.  There were no early apparent complications.     CONCLUSIONS:   1. Successful implantation of a Medtronic Reveal LINQ implantable loop recorder for cryptogenic stroke  2. No early apparent  complications.

## 2013-10-01 NOTE — Progress Notes (Signed)
Stroke Team Progress Note  HISTORY Justin Sexton is an 53 y.o. male who was at work 09/28/2013 when he suddenly noted right arm that became right arm and leg weakness. Patient sat down due to increased right sided weakness. He did not fall or have numbness or tingling of right arm or leg. HE was brought to [facility] hospital that day for further evaluation of symptoms. At time of arrival to ED his symptoms had almost fully resolved. Within one hour his symptoms had fully resolved. He is back to his baseline. Last known well 09/28/2013, time unknown. Patient was not administered TPA secondary to symptoms resolved, time last well unknown. He was admitted for further evaluation and treatment.  SUBJECTIVE His wife is at the bedside.  They are asking appropriate questions related to his procedure this afternoon. Dr. Rayann Heman just left the room.  OBJECTIVE Most recent Vital Signs: Filed Vitals:   09/30/13 2244 10/01/13 0146 10/01/13 0633 10/01/13 1020  BP: 103/64 110/59  115/55  Pulse: 54 55 63 62  Temp: 98.1 F (36.7 C) 97.7 F (36.5 C) 97.3 F (36.3 C) 98.1 F (36.7 C)  TempSrc: Oral Oral Oral Oral  Resp: 20 18 18 20   Height:      Weight:      SpO2: 96% 97% 97% 95%   CBG (last 3)   Recent Labs  09/28/13 1322  GLUCAP 126*    IV Fluid Intake:     MEDICATIONS  . aspirin  325 mg Oral Daily  . atorvastatin  40 mg Oral q1800  . clopidogrel  75 mg Oral Daily  . enoxaparin (LOVENOX) injection  30 mg Subcutaneous Q24H  . ezetimibe  10 mg Oral Daily  . loratadine  10 mg Oral QHS  . metoprolol  25 mg Oral BID   PRN:  nitroGLYCERIN, senna-docusate  Diet:  NPO  For TEE Activity:  OOB DVT Prophylaxis:  Lovenox 30 mg sq daily   CLINICALLY SIGNIFICANT STUDIES Basic Metabolic Panel:   Recent Labs Lab 09/28/13 1325 09/28/13 1900  NA 142  --   K 4.2  --   CL 104  --   CO2 22  --   GLUCOSE 141*  --   BUN 19  --   CREATININE 1.00 0.82  CALCIUM 10.1  --    Liver Function Tests:   Recent  Labs Lab 09/28/13 1325  AST 27  ALT 35  ALKPHOS 86  BILITOT 0.6  PROT 8.0  ALBUMIN 4.5   CBC:   Recent Labs Lab 09/28/13 1325 09/28/13 1900  WBC 7.3 7.7  NEUTROABS 4.3  --   HGB 15.3 15.4  HCT 45.1 45.1  MCV 89.3 89.1  PLT 175 202   Coagulation: No results found for this basename: LABPROT, INR,  in the last 168 hours Cardiac Enzymes:   Recent Labs Lab 09/28/13 1858 09/29/13 0128 09/29/13 0727  TROPONINI <0.30 <0.30 <0.30   Urinalysis: No results found for this basename: COLORURINE, APPERANCEUR, LABSPEC, PHURINE, GLUCOSEU, HGBUR, BILIRUBINUR, KETONESUR, PROTEINUR, UROBILINOGEN, NITRITE, LEUKOCYTESUR,  in the last 168 hours Lipid Panel    Component Value Date/Time   CHOL 126 09/29/2013 0128   TRIG 118 09/29/2013 0128   HDL 35* 09/29/2013 0128   CHOLHDL 3.6 09/29/2013 0128   VLDL 24 09/29/2013 0128   LDLCALC 67 09/29/2013 0128   HgbA1C  Lab Results  Component Value Date   HGBA1C 5.9* 09/29/2013    Urine Drug Screen:   No results found for this basename:  labopia,  cocainscrnur,  labbenz,  amphetmu,  thcu,  labbarb    Alcohol Level: No results found for this basename: ETH,  in the last 168 hours   CT of the brain  09/28/2013    Normal head CT with no acute intracranial process identified.   CT Angio Head & Neck  09/29/2013    1. Irregular calcified and noncalcified plaque within the proximal left internal carotid artery results an an irregular stenosis of slightly greater than 50%. 2. Smooth narrowing of the proximal right internal carotid artery without significant stenosis. 3. Mild atherosclerotic irregularity within the petrous left internal carotid artery without a significant stenosis. 4. Mild distal small vessel disease bilaterally. 5. No significant proximal stenosis, aneurysm, or branch vessel occlusion within the circle of Willis. 6. The high posterior left frontal lobe infarct is visualized with the aid of the prior MRI. There is no significant interval change.     MRI of the brain  09/28/2013    Small, acute posterior left frontal lobe infarcts.  MRA of the brain  09/28/2013    New major intracranial arterial occlusion. 3. 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    Carotid Doppler  Findings suggest upper range 1-39% right internal carotid artery stenosis and 40-59% left internal carotid artery stenosis. Vertebral arteries are patent with antegrade flow.  2D Echocardiogram  EF 55-60% with no source of embolus. Normal biventricular size and function. Impaired relaxation. Normal filling pressures. No significant valvular abnormalities. PASP can&'t be assessed on current study.  CXR  09/28/2013   There is no evidence of CHF nor pneumonia nor other acute cardiopulmonary abnormality.     EKG  normal sinus rhythm. For complete results please see formal report.   Therapy Recommendations no OT  Physical Exam   Pleasant obese middle aged Caucasian male not in distress.Awake alert. Afebrile. Head is nontraumatic. Neck is supple without bruit. Hearing is normal. Cardiac exam no murmur or gallop. Lungs are clear to auscultation. Distal pulses are well felt. Neurological Exam ;  Awake  Alert oriented x 3. Normal speech and language.eye movements full without nystagmus.fundi were not visualized. Vision acuity and fields appear normal. Hearing is normal. Palatal movements are normal. Face symmetric. Tongue midline. Normal strength, tone, reflexes and coordination. Normal sensation. Gait deferred.  ASSESSMENT Mr. Justin Sexton is a 53 y.o. male presenting with right arm and leg weakness which resolved.  Imaging confirms a left parietal infarct. Infarct felt to be embolic secondary to unknown source.  On aspirin 81 mg orally every day prior to admission. Now on clopidogrel 75 mg orally every day for secondary stroke prevention. Patient with no resultant neuro deficits. Stroke work up  underway.   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 TEE to look for embolic source. Arranged with Archie for today. If positive for PFO (patent foramen ovale), check bilateral lower extremity venous dopplers to rule out DVT as possible source of stroke.  If TEE negative, a [place] electrophysiologist will place an implantable loop recorder to evaluate for atrial fibrillation  as etiology of stroke. This has been explained to patient/family by Dr. Leonie Man and they are agreeable. Anticipate discharge today after procedure is completed Follow up Dr. Erlinda Hong in the office in 4 weeks.  Burnetta Sabin, MSN, RN, ANVP-BC, ANP-BC, [facility] Pager: [phone] 10/01/2013 11:32 AM I have personally examined this patient, reviewed notes, independently viewed imaging studies, participated in medical decision making and plan of care. I have made any additions or clarifications directly to the above note. Agree with note above.  Antony Contras, MD Medical Director [facility] Pager: [phone] 10/01/2013 5:13 PM     To contact Stroke Continuity provider, please refer to [email or web address] After hours, contact General Neurology

## 2013-10-01 NOTE — Op Note (Signed)
INDICATIONS: stroke  PROCEDURE:   Informed consent was obtained prior to the procedure. The risks, benefits and alternatives for the procedure were discussed and the patient comprehended these risks.  Risks include, but are not limited to, cough, sore throat, vomiting, nausea, somnolence, esophageal and stomach trauma or perforation, bleeding, low blood pressure, aspiration, pneumonia, infection, trauma to the teeth and death.    After a procedural time-out, the oropharynx was anesthetized with 20% benzocaine spray. The patient was given 5 mg versed and 75 mcg fentanyl for moderate sedation.   The transesophageal probe was inserted in the esophagus and stomach without difficulty and multiple views were obtained.  The patient was kept under observation until the patient left the procedure room.  The patient left the procedure room in stable condition.   Agitated microbubble saline contrast was administered.  COMPLICATIONS:    There were no immediate complications.  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  RECOMMENDATIONS:   Consider repeat TEE in a few months  Time Spent Directly with the Patient:  60 minutes   Conny Moening 10/01/2013, 2:11 PM

## 2013-10-01 NOTE — Progress Notes (Signed)
Echocardiogram Echocardiogram Transesophageal has been performed.  Joelene Millin 10/01/2013, 2:45 PM

## 2013-10-01 NOTE — Discharge Summary (Signed)
Physician Discharge Summary  Justin Sexton YBO:[id number] DOB: 03/10/1960 DOA: 09/28/2013  PCP: Glenda Chroman., MD  Admit date: 09/28/2013 Discharge date: 10/01/2013  Time spent: 45 minutes  Recommendations for Outpatient Follow-up:  Patient will be discharged to home. He is to follow up with his primary care physician within one week of discharge. Patient should also follow up with cardiology within 10 days for incision evaluation. Patient should also follow up with Dr. Erlinda Hong, neurologist, within 4 weeks of discharge. Patient to continue normal physical activity as tolerated. Patient should follow a heart healthy diet. Patient to continue taking his medications as prescribed.  Discharge Diagnoses:  Acute CVA Hypertension Coronary artery disease Hyperlipidemia  Discharge Condition: Stable  Diet recommendation: Heart healthy  Filed Weights   09/28/13 1322  Weight: 125.193 kg (276 lb)    History of present illness:  on 09/28/2013  This is a 53 y.o. year old male with significant past medical history of HLD, HTN, CAD s/p CABG around 1999, prior TIA presenting with TIA. Patient stated that while at work, he had sudden onset of R sided hand and leg numbness/weakness. His symptoms persisted for almost 1 hour and then resolved. Reported some mild dizziness, but no syncope, falls. No CP, SOB, nausea. Tried to dial on the phone with his R hand, but was unable to do so over the course of the hour. Patient was brought to [facility] for further evaluation. Workup essentially negative including Head CT, CBC, BMET. Trop, EKG NSR. Patient admitted for TIA workup. Stated that he takes 2 baby ASA daily.   Hospital Course:  Acute CVA  -Right-sided numbness and weakness resolved  -CT head showed normal head CT with no acute intracranial process identified.  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 Vertebral arteries are patent with antegrade flow.  -Echocardiogram: EF 27-78%, Grade 1 diastolic dysfunction  -Hemoglobin A1c 5.9, LDL 67  -Continue plavix and statin for secondary stroke prevention -Neurology consulted and appreciated  -PT and OT consulted and patient does not require further treatment  -neurology feels this to be embolic in nature and would like patient to have a TEE and/or loop recorder placed  -TEE was negative for vegetations  -Loop recorder was placed -Patient will need to followup with neurology in 4 weeks as well as cardiology within 10 days for evaluation of incision  Hypertension  -Stable, continue metoprolol   Coronary artery disease  -Currently chest pain-free, troponin negative  -Continue metoprolol and aspirin   Hyperlipidemia  -LDL 67, continue Lipitor and zetia  Procedures: Carotid doppler: 1-39% right internal carotid artery stenosis and 40-59% left internal carotid artery stenosis. Vertebral arteries are patent with antegrade flow.   Echocardiogram  Study Conclusions - Left ventricle: The cavity size was normal. There was mild concentric hypertrophy. Systolic function was normal. The estimated ejection fraction was in the range of 55% to 60%. Wall motion was normal; there were no regional wall motion abnormalities. Doppler parameters are consistent with abnormal left ventricular relaxation (grade 1 diastolic dysfunction).  - Aortic valve: Trileaflet; normal thickness leaflets. There was no regurgitation. - Aortic root: The aortic root was normal in size. - Mitral valve: Structurally normal valve. There was no regurgitation. - Left atrium: The atrium was moderately dilated. - Right ventricle: Systolic function was normal. - Pulmonic  valve: There was mild regurgitation. - Pericardium, extracardiac: There was no pericardial  effusion. Impressions: Normal biventricular size and function. Impaired relaxation. Normal filling pressures. No significant valvular abnormalities. PASP can&'t be assessed on current study.  Transesophageal echocardiogram Negative  Loop recorder implantation  Consultations: Neurology Cardiology  Discharge Exam: Filed Vitals:   10/01/13 1500  BP: 117/59  Pulse: 65  Temp:   Resp: 16   Exam  General: Well developed, well nourished, NAD, appears stated age  HEENT: NCAT, mucous membranes moist.  Cardiovascular: S1 S2 auscultated, Regular rate and rhythm.  Respiratory: Clear to auscultation bilaterally with equal chest rise  Abdomen: Soft, nontender, nondistended, + bowel sounds  Extremities: warm dry without cyanosis clubbing or edema  Neuro: AAOx3, no focal deficits  Psych: Normal affect and demeanor with intact judgement and insight  Discharge Instructions      Discharge Instructions   Discharge instructions    Complete by:  As directed   Patient will be discharged to home. He is to follow up with his primary care physician within one week of discharge. Patient should also follow up with cardiology within 10 days for incision evaluation. Patient should also follow up with Dr. Erlinda Hong, neurologist, within 4 weeks of discharge. Patient to continue normal physical activity as tolerated. Patient should follow a heart healthy diet. Patient to continue taking his medications as prescribed.            Medication List    STOP taking these medications       aspirin EC 81 MG tablet      TAKE these medications       calcium-vitamin D 500-200 MG-UNIT per tablet  Commonly known as:  OSCAL WITH D  Take 1 tablet by mouth daily.     clopidogrel 75 MG tablet  Commonly known as:  PLAVIX  Take 1 tablet (75 mg total) by mouth daily.     ezetimibe 10 MG tablet  Commonly known as:  ZETIA  Take 10 mg by mouth daily.     fish oil-omega-3 fatty acids 1000 MG capsule  Take 1 g by  mouth daily.     levocetirizine 5 MG tablet  Commonly known as:  XYZAL  Take 5 mg by mouth daily.     metoprolol 50 MG tablet  Commonly known as:  LOPRESSOR  Take 25 mg by mouth 2 (two) times daily.     NITROSTAT 0.4 MG SL tablet  Generic drug:  nitroGLYCERIN  Place 0.4 mg under the tongue every 5 (five) minutes as needed for chest pain.     rosuvastatin 20 MG tablet  Commonly known as:  CRESTOR  Take 20 mg by mouth daily.       Allergies  Allergen Reactions  . Codeine Nausea And Vomiting   Follow-up Information   Follow up with VYAS,DHRUV B., MD. Schedule an appointment as soon as possible for a visit in 1 week. [facility] followup)    Specialty:  Internal Medicine   Contact information:   [address] [place] [address] [phone]       Follow up with Xu,Jindong, MD In 4 weeks. [facility] followup, stroke clinic, Dr. Erlinda Hong is a stroke MD and Dr. Clydene Fake partner)    Specialty:  Neurology   Contact information:   [address] [place] [address] [phone]        The results of significant diagnostics from this hospitalization (including imaging, microbiology, ancillary and laboratory) are listed below for  reference.    Significant Diagnostic Studies: Ct Angio Head W/cm &/or Wo Cm  09/29/2013   CLINICAL DATA:  Posterior left frontal lobe infarct. Atherosclerotic changes in the petrous left internal carotid artery.  EXAM: CT ANGIOGRAPHY HEAD AND NECK  TECHNIQUE: Multidetector CT imaging of the head and neck was performed using the standard protocol during bolus administration of intravenous contrast. Multiplanar CT image reconstructions and MIPs were obtained to evaluate the vascular anatomy. Carotid stenosis measurements (when applicable) are obtained utilizing NASCET criteria, using the distal internal carotid diameter as the denominator.  CONTRAST:  47mL OMNIPAQUE IOHEXOL 350 MG/ML SOLN  COMPARISON:  MRI brain and MRA head 09/28/2013  FINDINGS: CTA  HEAD FINDINGS  The acute punctate nonhemorrhagic infarct in the high posterior left frontal lobe is stable. No additional acute infarcts are present. The ventricles are of normal size. No significant extraaxial fluid collection is present.  The paranasal sinuses and mastoid air cells are clear. The osseous skull is intact.  The postcontrast images demonstrate no pathologic enhancement.  The narrowing of the petrous left internal carotid artery is confirmed. Atherosclerotic changes are present within the cavernous internal carotid arteries bilaterally without a significant stenosis. The A1 and M1 segments are normal. The anterior communicating artery is patent. The MCA bifurcations are intact. There is some attenuation of distal ACA and MCA branch vessels. No focal stenosis or occlusion is evident to account for the acute infarct.  Atherosclerotic irregularities are present within the vertebral arteries without significant stenosis. The basilar artery is intact. Both posterior cerebral arteries originate from the basilar tip. There is some irregular attenuation of distal PCA branch vessels bilaterally.  Review of the MIP images confirms the above findings.  CTA NECK FINDINGS  A 3 vessel arch configuration is present.  The vertebral arteries originate from the subclavian arteries bilaterally. The dural opacification is somewhat suboptimal. There are no significant vertebral artery stenoses within the neck.  The right common carotid artery is within normal limits. Calcified and noncalcified atherosclerotic plaque narrowing of the proximal right internal carotid artery to 3.0 mm. This compares with 4.3 mm in the more distal vessel.  The left common carotid artery demonstrates minimal atherosclerotic calcification at its origin without significant stenosis. There is irregular calcified and noncalcified plaque within the proximal left internal carotid artery creating an eccentric filling defect. The minimal luminal  diameter is 2.1 mm. This compares with a more distal vessel at 4.7 mm.  The soft tissues the neck are within normal limits. There is no significant adenopathy. The lung apices are clear.  Mild degenerative changes are present in the cervical spine with uncovertebral spurring at C5-6. Mild osseous foraminal narrowing is present on the right. A focal sclerotic lesion is present within the left posterior aspect of C3 and extending into the pedicle. This appears benign.  Review of the MIP images confirms the above findings.  IMPRESSION: 1. Irregular calcified and noncalcified plaque within the proximal left internal carotid artery results an an irregular stenosis of slightly greater than 50%. 2. Smooth narrowing of the proximal right internal carotid artery without significant stenosis. 3. Mild atherosclerotic irregularity within the petrous left internal carotid artery without a significant stenosis. 4. Mild distal small vessel disease bilaterally. 5. No significant proximal stenosis, aneurysm, or branch vessel occlusion within the circle of Willis. 6. The high posterior left frontal lobe infarct is visualized with the aid of the prior MRI. There is no significant interval change.   Electronically Signed   By:  Lawrence Santiago M.D.   On: 09/29/2013 21:36   Dg Chest 2 View  09/28/2013   CLINICAL DATA:  Stroke symptoms ; history of CABG and hypertension  EXAM: CHEST  2 VIEW  COMPARISON:  None.  FINDINGS: The lungs are adequately inflated and clear. The heart and mediastinal structures exhibit no acute abnormalities. 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 The bony thorax exhibits no acute abnormality.  IMPRESSION: There is no evidence of CHF nor pneumonia nor other acute cardiopulmonary abnormality.   Electronically Signed   By: David  Martinique   On: 09/28/2013 19:40   Ct Head Wo Contrast  09/28/2013   CLINICAL DATA:  Extremity weakness  EXAM: CT HEAD WITHOUT CONTRAST  TECHNIQUE: Contiguous  axial images were obtained from the base of the skull through the vertex without intravenous contrast.  COMPARISON:  None.  FINDINGS: There is no acute intracranial hemorrhage or infarct. No mass lesion or midline shift. Gray-white matter differentiation is well maintained. Ventricles are normal in size without evidence of hydrocephalus. CSF containing spaces are within normal limits. No extra-axial fluid collection.  The calvarium is intact.  Orbital soft tissues are within normal limits.  The paranasal sinuses and mastoid air cells are well pneumatized and free of fluid.  Scalp soft tissues are unremarkable.  IMPRESSION: Normal head CT with no acute intracranial process identified.   Electronically Signed   By: Jeannine Boga M.D.   On: 09/28/2013 13:44   Ct Angio Neck W/cm &/or Wo/cm  09/29/2013   CLINICAL DATA:  Posterior left frontal lobe infarct. Atherosclerotic changes in the petrous left internal carotid artery.  EXAM: CT ANGIOGRAPHY HEAD AND NECK  TECHNIQUE: Multidetector CT imaging of the head and neck was performed using the standard protocol during bolus administration of intravenous contrast. Multiplanar CT image reconstructions and MIPs were obtained to evaluate the vascular anatomy. Carotid stenosis measurements (when applicable) are obtained utilizing NASCET criteria, using the distal internal carotid diameter as the denominator.  CONTRAST:  18mL OMNIPAQUE IOHEXOL 350 MG/ML SOLN  COMPARISON:  MRI brain and MRA head 09/28/2013  FINDINGS: CTA HEAD FINDINGS  The acute punctate nonhemorrhagic infarct in the high posterior left frontal lobe is stable. No additional acute infarcts are present. The ventricles are of normal size. No significant extraaxial fluid collection is present.  The paranasal sinuses and mastoid air cells are clear. The osseous skull is intact.  The postcontrast images demonstrate no pathologic enhancement.  The narrowing of the petrous left internal carotid artery is  confirmed. Atherosclerotic changes are present within the cavernous internal carotid arteries bilaterally without a significant stenosis. The A1 and M1 segments are normal. The anterior communicating artery is patent. The MCA bifurcations are intact. There is some attenuation of distal ACA and MCA branch vessels. No focal stenosis or occlusion is evident to account for the acute infarct.  Atherosclerotic irregularities are present within the vertebral arteries without significant stenosis. The basilar artery is intact. Both posterior cerebral arteries originate from the basilar tip. There is some irregular attenuation of distal PCA branch vessels bilaterally.  Review of the MIP images confirms the above findings.  CTA NECK FINDINGS  A 3 vessel arch configuration is present.  The vertebral arteries originate from the subclavian arteries bilaterally. The dural opacification is somewhat suboptimal. There are no significant vertebral artery stenoses within the neck.  The right common carotid artery is within normal limits. Calcified and noncalcified atherosclerotic plaque narrowing of the proximal right internal carotid artery  to 3.0 mm. This compares with 4.3 mm in the more distal vessel.  The left common carotid artery demonstrates minimal atherosclerotic calcification at its origin without significant stenosis. There is irregular calcified and noncalcified plaque within the proximal left internal carotid artery creating an eccentric filling defect. The minimal luminal diameter is 2.1 mm. This compares with a more distal vessel at 4.7 mm.  The soft tissues the neck are within normal limits. There is no significant adenopathy. The lung apices are clear.  Mild degenerative changes are present in the cervical spine with uncovertebral spurring at C5-6. Mild osseous foraminal narrowing is present on the right. A focal sclerotic lesion is present within the left posterior aspect of C3 and extending into the pedicle. This  appears benign.  Review of the MIP images confirms the above findings.  IMPRESSION: 1. Irregular calcified and noncalcified plaque within the proximal left internal carotid artery results an an irregular stenosis of slightly greater than 50%. 2. Smooth narrowing of the proximal right internal carotid artery without significant stenosis. 3. Mild atherosclerotic irregularity within the petrous left internal carotid artery without a significant stenosis. 4. Mild distal small vessel disease bilaterally. 5. No significant proximal stenosis, aneurysm, or branch vessel occlusion within the circle of Willis. 6. The high posterior left frontal lobe infarct is visualized with the aid of the prior MRI. There is no significant interval change.   Electronically Signed   By: Lawrence Santiago M.D.   On: 09/29/2013 21:36   Mr Brain Wo Contrast  09/28/2013   CLINICAL DATA:  Sudden onset right upper extremity and right lower extremity numbness, tingling, and weakness. Symptoms since resolved. Evaluate for stroke.  EXAM: MRI HEAD WITHOUT CONTRAST  MRA HEAD WITHOUT CONTRAST  TECHNIQUE: Multiplanar, multiecho pulse sequences of the brain and surrounding structures were obtained without intravenous contrast. Angiographic images of the head were obtained using MRA technique without contrast.  COMPARISON:  Head CT 09/28/2013  FINDINGS: MRI HEAD FINDINGS  There are a few subcentimeter foci of restricted diffusion in the posterior left frontal lobe, consistent with acute infarcts. Trace T2 hyperintensity is present in these locations. Small, scattered foci of T2 hyperintensity elsewhere in the cerebral white matter are nonspecific but compatible with minimal chronic small vessel ischemic disease. Ventricles and sulci are within normal limits for age. There is no intracranial hemorrhage, mass, midline shift, or extra-axial fluid collection.  Orbits are unremarkable. Right maxillary sinus mucous retention cyst in mild left greater than right  ethmoid air cell mucosal thickening are noted. Trace bilateral mastoid effusions are present. Major intracranial vascular flow voids are preserved.  MRA HEAD FINDINGS  There is mild motion artifact. Visualized distal vertebral arteries are patent with the left being slightly larger than the right. PICA origins and SCA origins are patent. Basilar artery is patent without stenosis. PCAs are unremarkable aside from mild branch vessel irregularity. There are patent posterior communicating arteries bilaterally.  There is eccentric narrowing of the distal petrous ICA on the left with approximately 50% luminal stenosis. No right-sided intracranial ICA stenosis is seen. Slightly bulbous appearance of the anterior right cavernous carotid is favored to reflect vessel tortuosity and motion, without a definite aneurysm identified. ACAs and MCAs are unremarkable aside from mild branch vessel irregularity.  IMPRESSION: 1. Small, acute posterior left frontal lobe infarcts. 2. New major intracranial arterial occlusion. 3. 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   Electronically Signed  By: Logan Bores   On: 09/28/2013 20:54   Mr Mra Head/brain Wo Cm  09/28/2013   CLINICAL DATA:  Sudden onset right upper extremity and right lower extremity numbness, tingling, and weakness. Symptoms since resolved. Evaluate for stroke.  EXAM: MRI HEAD WITHOUT CONTRAST  MRA HEAD WITHOUT CONTRAST  TECHNIQUE: Multiplanar, multiecho pulse sequences of the brain and surrounding structures were obtained without intravenous contrast. Angiographic images of the head were obtained using MRA technique without contrast.  COMPARISON:  Head CT 09/28/2013  FINDINGS: MRI HEAD FINDINGS  There are a few subcentimeter foci of restricted diffusion in the posterior left frontal lobe, consistent with acute infarcts. Trace T2 hyperintensity is present in these locations. Small, scattered foci of  T2 hyperintensity elsewhere in the cerebral white matter are nonspecific but compatible with minimal chronic small vessel ischemic disease. Ventricles and sulci are within normal limits for age. There is no intracranial hemorrhage, mass, midline shift, or extra-axial fluid collection.  Orbits are unremarkable. Right maxillary sinus mucous retention cyst in mild left greater than right ethmoid air cell mucosal thickening are noted. Trace bilateral mastoid effusions are present. Major intracranial vascular flow voids are preserved.  MRA HEAD FINDINGS  There is mild motion artifact. Visualized distal vertebral arteries are patent with the left being slightly larger than the right. PICA origins and SCA origins are patent. Basilar artery is patent without stenosis. PCAs are unremarkable aside from mild branch vessel irregularity. There are patent posterior communicating arteries bilaterally.  There is eccentric narrowing of the distal petrous ICA on the left with approximately 50% luminal stenosis. No right-sided intracranial ICA stenosis is seen. Slightly bulbous appearance of the anterior right cavernous carotid is favored to reflect vessel tortuosity and motion, without a definite aneurysm identified. ACAs and MCAs are unremarkable aside from mild branch vessel irregularity.  IMPRESSION: 1. Small, acute posterior left frontal lobe infarcts. 2. New major intracranial arterial occlusion. 3. 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   Electronically Signed   By: Logan Bores   On: 09/28/2013 20:54    Microbiology: No results found for this or any previous visit (from the past 240 hour(s)).   Labs: Basic Metabolic Panel:  Recent Labs Lab 09/28/13 1325 09/28/13 1900  NA 142  --   K 4.2  --   CL 104  --   CO2 22  --   GLUCOSE 141*  --   BUN 19  --   CREATININE 1.00 0.82  CALCIUM 10.1  --    Liver Function Tests:  Recent Labs Lab  09/28/13 1325  AST 27  ALT 35  ALKPHOS 86  BILITOT 0.6  PROT 8.0  ALBUMIN 4.5   No results found for this basename: LIPASE, AMYLASE,  in the last 168 hours No results found for this basename: AMMONIA,  in the last 168 hours CBC:  Recent Labs Lab 09/28/13 1325 09/28/13 1900  WBC 7.3 7.7  NEUTROABS 4.3  --   HGB 15.3 15.4  HCT 45.1 45.1  MCV 89.3 89.1  PLT 175 202   Cardiac Enzymes:  Recent Labs Lab 09/28/13 1325 09/28/13 1858 09/29/13 0128 09/29/13 0727  TROPONINI <0.30 <0.30 <0.30 <0.30   BNP: BNP (last 3 results) No results found for this basename: PROBNP,  in the last 8760 hours CBG:  Recent Labs Lab 09/28/13 1322  GLUCAP 126*       Signed:  Radames Mejorado  [facility] 10/01/2013, 3:20 PM

## 2013-10-01 NOTE — Consult Note (Signed)
ELECTROPHYSIOLOGY CONSULT NOTE  Patient ID: Justin Sexton MRN: [id number], DOB/AGE: Jun 21, 1960   Admit date: 09/28/2013 Date of Consult: 10/01/2013  Primary Physician: Glenda Chroman., MD Primary Cardiologist: new to [facility] Reason for Consultation: Cryptogenic stroke; recommendations regarding Implantable Loop Recorder  History of Present Illness Justin Sexton was admitted on 09/28/2013 with right arm and leg weakness.  Imaging demonstrated left parietal infarct.  He has undergone workup for stroke including echocardiogram and carotid dopplers.  The patient has been monitored on telemetry which has demonstrated sinus rhythm with no arrhythmias (per report - not currently on telemetry).  Inpatient stroke work-up is to be completed with a TEE.   Echocardiogram this admission demonstrated EF 55-60%, no RWMA, grade 1 diastolic dysfunction, LA 48.  Lab work is reviewed  Prior to admission, the patient denies chest pain, shortness of breath, dizziness, palpitations, or syncope.  They are recovering from their stroke with plans to return home at discharge.  EP has been asked to evaluate for placement of an implantable loop recorder to monitor for atrial fibrillation.  ROS is negative except as outlined above.    Past Medical History  Diagnosis Date  . Hypertension   . H/O coronary artery bypass surgery     16 years ago, [place]  . TIA (transient ischemic attack)   . Hyperlipidemia   . Gout      Surgical History:  Past Surgical History  Procedure Laterality Date  . Colonoscopy N/A 09/03/2012    Procedure: COLONOSCOPY;  Surgeon: Rogene Houston, MD;  Location: [facility];  Service: Endoscopy;  Laterality: N/A;  1200  . Coronary artery bypass graft       Prescriptions prior to admission  Medication Sig Dispense Refill  . aspirin EC 81 MG tablet Take 162 mg by mouth daily.      . calcium-vitamin D (OSCAL WITH D) 500-200 MG-UNIT per tablet Take 1 tablet by mouth daily.        Marland Kitchen ezetimibe (ZETIA) 10 MG tablet Take 10 mg by mouth daily.      . fish oil-omega-3 fatty acids 1000 MG capsule Take 1 g by mouth daily.      Marland Kitchen levocetirizine (XYZAL) 5 MG tablet Take 5 mg by mouth daily.      . metoprolol (LOPRESSOR) 50 MG tablet Take 25 mg by mouth 2 (two) times daily.      Marland Kitchen NITROSTAT 0.4 MG SL tablet Place 0.4 mg under the tongue every 5 (five) minutes as needed for chest pain.       . rosuvastatin (CRESTOR) 20 MG tablet Take 20 mg by mouth daily.        Inpatient Medications:  . aspirin  325 mg Oral Daily  . atorvastatin  40 mg Oral q1800  . clopidogrel  75 mg Oral Daily  . enoxaparin (LOVENOX) injection  30 mg Subcutaneous Q24H  . ezetimibe  10 mg Oral Daily  . loratadine  10 mg Oral QHS  . metoprolol  25 mg Oral BID    Allergies:  Allergies  Allergen Reactions  . Codeine Nausea And Vomiting    History   Social History  . Marital Status: Married    Spouse Name: N/A    Number of Children: N/A  . Years of Education: N/A   Occupational History  . Not on file.   Social History Main Topics  . Smoking status: Former Research scientist (life sciences)  . Smokeless tobacco: Not on file  . Alcohol  Use: No  . Drug Use: No  . Sexual Activity: Not on file   Other Topics Concern  . Not on file   Social History Narrative  . No narrative on file     Family History  Problem Relation Age of Onset  . Hypertension Mother   . Hypertension Father     BP 110/59  Pulse 63  Temp(Src) 97.3 F (36.3 C) (Oral)  Resp 18  Ht 5\' 10"  (1.778 m)  Wt 276 lb (125.193 kg)  BMI 39.60 kg/m2  SpO2 97%  Physical Exam: Physical Exam: Filed Vitals:   10/01/13 0146 10/01/13 0633 10/01/13 1020 10/01/13 1306  BP: 110/59  115/55 158/75  Pulse: 55 63 62 69  Temp: 97.7 F (36.5 C) 97.3 F (36.3 C) 98.1 F (36.7 C) 98.5 F (36.9 C)  TempSrc: Oral Oral Oral Oral  Resp: 18 18 20 16   Height:      Weight:      SpO2: 97% 97% 95% 94%    GEN- The patient is well appearing, alert and oriented x  3 today.   Head- normocephalic, atraumatic Eyes-  Sclera clear, conjunctiva pink Ears- hearing intact Oropharynx- clear Neck- supple, Lungs- Clear to ausculation bilaterally, normal work of breathing Heart- Regular rate and rhythm  GI- soft, NT, ND, + BS Extremities- no clubbing, cyanosis, or edema, groin is without hematoma/ bruit MS- no significant deformity or atrophy Skin- no rash or lesion Psych- euthymic mood, full affect   Labs:   Lab Results  Component Value Date   WBC 7.7 09/28/2013   HGB 15.4 09/28/2013   HCT 45.1 09/28/2013   MCV 89.1 09/28/2013   PLT 202 09/28/2013    Recent Labs Lab 09/28/13 1325 09/28/13 1900  NA 142  --   K 4.2  --   CL 104  --   CO2 22  --   BUN 19  --   CREATININE 1.00 0.82  CALCIUM 10.1  --   PROT 8.0  --   BILITOT 0.6  --   ALKPHOS 86  --   ALT 35  --   AST 27  --   GLUCOSE 141*  --      Radiology/Studies: Ct Angio Head W/cm &/or Wo Cm 09/29/2013   CLINICAL DATA:  Posterior left frontal lobe infarct. Atherosclerotic changes in the petrous left internal carotid artery.  EXAM: CT ANGIOGRAPHY HEAD AND NECK  TECHNIQUE: Multidetector CT imaging of the head and neck was performed using the standard protocol during bolus administration of intravenous contrast. Multiplanar CT image reconstructions and MIPs were obtained to evaluate the vascular anatomy. Carotid stenosis measurements (when applicable) are obtained utilizing NASCET criteria, using the distal internal carotid diameter as the denominator.  CONTRAST:  42mL OMNIPAQUE IOHEXOL 350 MG/ML SOLN  COMPARISON:  MRI brain and MRA head 09/28/2013  FINDINGS: CTA HEAD FINDINGS  The acute punctate nonhemorrhagic infarct in the high posterior left frontal lobe is stable. No additional acute infarcts are present. The ventricles are of normal size. No significant extraaxial fluid collection is present.  The paranasal sinuses and mastoid air cells are clear. The osseous skull is intact.  The postcontrast  images demonstrate no pathologic enhancement.  The narrowing of the petrous left internal carotid artery is confirmed. Atherosclerotic changes are present within the cavernous internal carotid arteries bilaterally without a significant stenosis. The A1 and M1 segments are normal. The anterior communicating artery is patent. The MCA bifurcations are intact. There is some attenuation of distal  ACA and MCA branch vessels. No focal stenosis or occlusion is evident to account for the acute infarct.  Atherosclerotic irregularities are present within the vertebral arteries without significant stenosis. The basilar artery is intact. Both posterior cerebral arteries originate from the basilar tip. There is some irregular attenuation of distal PCA branch vessels bilaterally.  Review of the MIP images confirms the above findings.  CTA NECK FINDINGS  A 3 vessel arch configuration is present.  The vertebral arteries originate from the subclavian arteries bilaterally. The dural opacification is somewhat suboptimal. There are no significant vertebral artery stenoses within the neck.  The right common carotid artery is within normal limits. Calcified and noncalcified atherosclerotic plaque narrowing of the proximal right internal carotid artery to 3.0 mm. This compares with 4.3 mm in the more distal vessel.  The left common carotid artery demonstrates minimal atherosclerotic calcification at its origin without significant stenosis. There is irregular calcified and noncalcified plaque within the proximal left internal carotid artery creating an eccentric filling defect. The minimal luminal diameter is 2.1 mm. This compares with a more distal vessel at 4.7 mm.  The soft tissues the neck are within normal limits. There is no significant adenopathy. The lung apices are clear.  Mild degenerative changes are present in the cervical spine with uncovertebral spurring at C5-6. Mild osseous foraminal narrowing is present on the right. A  focal sclerotic lesion is present within the left posterior aspect of C3 and extending into the pedicle. This appears benign.  Review of the MIP images confirms the above findings.  IMPRESSION: 1. Irregular calcified and noncalcified plaque within the proximal left internal carotid artery results an an irregular stenosis of slightly greater than 50%. 2. Smooth narrowing of the proximal right internal carotid artery without significant stenosis. 3. Mild atherosclerotic irregularity within the petrous left internal carotid artery without a significant stenosis. 4. Mild distal small vessel disease bilaterally. 5. No significant proximal stenosis, aneurysm, or branch vessel occlusion within the circle of Willis. 6. The high posterior left frontal lobe infarct is visualized with the aid of the prior MRI. There is no significant interval change.   Electronically Signed   By: Lawrence Santiago M.D.   On: 09/29/2013 21:36   Dg Chest 2 View 09/28/2013   CLINICAL DATA:  Stroke symptoms ; history of CABG and hypertension  EXAM: CHEST  2 VIEW  COMPARISON:  None.  FINDINGS: The lungs are adequately inflated and clear. The heart and mediastinal structures exhibit no acute abnormalities. 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 The bony thorax exhibits no acute abnormality.  IMPRESSION: There is no evidence of CHF nor pneumonia nor other acute cardiopulmonary abnormality.   Electronically Signed   By: David  Martinique   On: 09/28/2013 19:40    12-lead ECG sinus rhythm, rate 71, normal intervals  Telemetry sinus rhythm with no arrhythmias via review of saved strips  Assessment and Plan:  1. Cryptogenic stroke The patient presents with cryptogenic stroke.  The patient has a TEE planned for this AM.  I spoke at length with the patient about monitoring for afib with either a 30 day event monitor or an implantable loop recorder.  Risks, benefits, and alteratives to implantable loop recorder were discussed  with the patient today.   At this time, the patient is very clear in their decision to proceed with implantable loop recorder.   Please call with questions.

## 2013-10-04 ENCOUNTER — Encounter (HOSPITAL_COMMUNITY): Payer: Self-pay | Admitting: Cardiovascular Disease

## 2013-10-13 ENCOUNTER — Ambulatory Visit: Payer: BC Managed Care – PPO

## 2013-10-13 ENCOUNTER — Encounter: Payer: Self-pay | Admitting: Internal Medicine

## 2013-10-22 ENCOUNTER — Telehealth: Payer: Self-pay | Admitting: Internal Medicine

## 2013-10-22 NOTE — Telephone Encounter (Signed)
Informed pt wife to call tech support. Tech support should have her answer as to why the machine would beep.

## 2013-10-22 NOTE — Telephone Encounter (Signed)
New Prob   States husbands device began beeping in the middle of the night a few nights ago. Denies any pain during this episode. Wife is requesting to speak to someone regarding this. Please call.

## 2013-10-28 ENCOUNTER — Ambulatory Visit (INDEPENDENT_AMBULATORY_CARE_PROVIDER_SITE_OTHER): Payer: BC Managed Care – PPO | Admitting: *Deleted

## 2013-10-28 DIAGNOSIS — I6350 Cerebral infarction due to unspecified occlusion or stenosis of unspecified cerebral artery: Secondary | ICD-10-CM

## 2013-10-28 LAB — MDC_IDC_ENUM_SESS_TYPE_INCLINIC

## 2013-10-28 NOTE — Progress Notes (Signed)
Wound check-ILR.  Steri strips removed by the patient.  Wound well healed.  R-waves .33-.72mV.  No episodes recorded.  Follow up via [facility]

## 2013-10-29 ENCOUNTER — Ambulatory Visit (INDEPENDENT_AMBULATORY_CARE_PROVIDER_SITE_OTHER): Payer: BC Managed Care – PPO | Admitting: *Deleted

## 2013-10-29 DIAGNOSIS — I6350 Cerebral infarction due to unspecified occlusion or stenosis of unspecified cerebral artery: Secondary | ICD-10-CM

## 2013-11-03 ENCOUNTER — Encounter: Payer: Self-pay | Admitting: Internal Medicine

## 2013-11-04 NOTE — Progress Notes (Signed)
Loop recorder 

## 2013-11-11 ENCOUNTER — Ambulatory Visit: Payer: Self-pay | Admitting: Neurology

## 2013-11-16 LAB — MDC_IDC_ENUM_SESS_TYPE_REMOTE
MDC IDC SESS DTM: 20150911202803
MDC IDC SET ZONE DETECTION INTERVAL: 340 ms
Zone Setting Detection Interval: 2000 ms
Zone Setting Detection Interval: 3000 ms

## 2013-11-22 ENCOUNTER — Ambulatory Visit (INDEPENDENT_AMBULATORY_CARE_PROVIDER_SITE_OTHER): Payer: BC Managed Care – PPO | Admitting: Neurology

## 2013-11-22 ENCOUNTER — Encounter (INDEPENDENT_AMBULATORY_CARE_PROVIDER_SITE_OTHER): Payer: Self-pay

## 2013-11-22 ENCOUNTER — Encounter: Payer: Self-pay | Admitting: Neurology

## 2013-11-22 ENCOUNTER — Ambulatory Visit: Payer: Self-pay | Admitting: Neurology

## 2013-11-22 VITALS — BP 141/85 | HR 61 | Ht 70.0 in | Wt 274.8 lb

## 2013-11-22 DIAGNOSIS — I639 Cerebral infarction, unspecified: Secondary | ICD-10-CM | POA: Insufficient documentation

## 2013-11-22 DIAGNOSIS — I330 Acute and subacute infective endocarditis: Secondary | ICD-10-CM | POA: Insufficient documentation

## 2013-11-22 DIAGNOSIS — I10 Essential (primary) hypertension: Secondary | ICD-10-CM

## 2013-11-22 NOTE — Progress Notes (Signed)
STROKE NEUROLOGY FOLLOW UP NOTE  NAME: Justin Sexton DOB: [birth year]  REASON FOR VISIT: stroke follow up HISTORY FROM: pt and chart  Today we had the pleasure of seeing Justin Sexton in follow-up at our Neurology Clinic. Pt was accompanied by no one.   History Summary 53 yo M with PMH of HTN, HLD, CAD s/p CABG in 1997, questionable TIA 6-7 years ago was admitted on 09/28/13 due to acute onset right arm and leg weakness which started to improve after 20-57min. When he was in ER, symptoms largely resolved. He did not get tPA. 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 His CTA neck showed bilateral soft plaques at proximal ICAs, especially on the left with 50% stenosis. TEE done showed a very small, filamentous mobile echodensity on the aortic valve, probably attached to the non coronary cusp. 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 He had loop recorder placed last month. So far no Afib episodes.   Interval History During the interval time, the patient has been doing well. No recurrent symptoms. Continued on plavix and crestor for stroke prevention. BP today in clinic 141/85.   REVIEW OF SYSTEMS: Full 14 system review of systems performed and notable only for those listed below and in HPI above, all others are negative:  Constitutional: N/A  Cardiovascular: N/A  Ear/Nose/Throat: ringing in ears  Skin: N/A  Eyes: N/A  Respiratory: runny nose  Gastroitestinal: N/A  Genitourinary: N/A Hematology/Lymphatic: N/A  Endocrine: N/A  Musculoskeletal: N/A  Allergy/Immunology: N/A  Neurological: N/A  Psychiatric: N/A  The following represents the patient's updated allergies and side effects list: Allergies  Allergen Reactions  . Codeine Nausea And Vomiting    Labs since last visit of relevance include the following: Results for orders placed in visit on 10/29/13    [id number]      Result Value Ref Range   Date Time Interrogation Session [phone]     Pulse Generator Manufacturer Medtronic     Pulse Gen Model NTZ00 Reveal LINQ     Pulse Gen Serial Number [id number]     Zone Setting Type Category VF     Zone Setting Type Category VT     Zone Setting Type Category VENTRICULAR_TACHYCARDIA_1     Zone Detect Interval 340     Zone Setting Type Category VENTRICULAR_TACHYCARDIA_2     Zone Setting Type Category ATRIAL_FIBRILLATION     Zone Setting Type Category ATAF     Zone Setting Type Category ASYSTOLE     Zone Detect Interval 3000     Zone Setting Type Category BRADYCARDIA     Zone Detect Interval 2000     Battery Status OK     Eval Rhythm SR     Miscellaneous Comment       Value: [facility] summary report received. Battery status OK. Normal device function. No symptom episodes, tachy episodes, brady or pause episodes. No AF episodes. Monthly summary reports and PRN with JA.    The neurologically relevant items on the patient's problem list were reviewed on today's visit.  Neurologic Examination  A problem focused neurological exam (12 or more points of the single system neurologic examination, vital signs counts as 1 point, cranial nerves count for 8 points) was performed.  Blood pressure 141/85, pulse 61, height 5\' 10"  (1.778 m), weight 274 lb 12.8 oz (124.648 kg).  General - Well nourished, well developed, in no apparent distress.  Ophthalmologic -  Sharp disc margins OU.  Cardiovascular - Regular rate and rhythm with no murmur.  Mental Status -  Level of arousal and orientation to time, place, and person were intact. Language including expression, naming, repetition, comprehension, reading, and writing was assessed and found intact. Attention span and concentration were normal. Recent and remote memory were intact. Fund of Knowledge was assessed and was intact.  Cranial Nerves II - XII - II - Visual field intact  OU. III, IV, VI - Extraocular movements intact. V - Facial sensation intact bilaterally. VII - Facial movement intact bilaterally. VIII - Hearing & vestibular intact bilaterally. X - Palate elevates symmetrically. XI - Chin turning & shoulder shrug intact bilaterally. XII - Tongue protrusion intact.  Motor Strength - The patient's strength was normal in all extremities and pronator drift was absent.  Bulk was normal and fasciculations were absent.   Motor Tone - Muscle tone was assessed at the neck and appendages and was normal.  Reflexes - The patient's reflexes were normal in all extremities and he had no pathological reflexes.  Sensory - Light touch, temperature/pinprick, vibration and proprioception, and Romberg testing were assessed and were normal.    Coordination - The patient had normal movements in the hands and feet with no ataxia or dysmetria.  Tremor was absent.  Gait and Station - The patient's transfers, posture, gait, station, and turns were observed as normal.  Data reviewed: I personally reviewed the images and agree with the radiology interpretations.  CT of the brain 09/28/2013 Normal head CT with no acute intracranial process identified.  CT Angio Head & Neck 09/29/2013 1. Irregular calcified and noncalcified plaque within the proximal left internal carotid artery results an an irregular stenosis of slightly greater than 50%. 2. Smooth narrowing of the proximal right internal carotid artery without significant stenosis. 3. Mild atherosclerotic irregularity within the petrous left internal carotid artery without a significant stenosis. 4. Mild distal small vessel disease bilaterally. 5. No significant proximal stenosis, aneurysm, or branch vessel occlusion within the circle of Willis. 6. The high posterior left frontal lobe infarct is visualized with the aid of the prior MRI. There is no significant interval change.  MRI of the brain 09/28/2013 Small, acute posterior left  frontal lobe infarcts.  MRA of the brain 09/28/2013 New major intracranial arterial occlusion. 3. 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  Carotid Doppler Findings suggest upper range 1-39% right internal carotid artery stenosis and 40-59% left internal carotid artery stenosis. Vertebral arteries are patent with antegrade flow.  2D Echocardiogram EF 55-60% with no source of embolus. Normal biventricular size and function. Impaired relaxation. Normal filling pressures. No significant valvular abnormalities. PASP can&'t be assessed on current study.  CXR 09/28/2013 There is no evidence of CHF nor pneumonia nor other acute cardiopulmonary abnormality.  EKG normal sinus rhythm.  TEE 10/01/13 - Left ventricle: Systolic function was normal. The estimated ejection fraction was in the range of 55% to 60%. Wall motion was normal; there were no regional wall motion abnormalities. - Aortic valve: There is a very small, filamentous mobile echodensity on the aortic valve, probably attached to the non coronary cusp. It prolapses into the LV outflow tract in diastole. 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 If no other etiology is identified for the patient&'s ischemic stroke, consider repeat TEE in a few months. - Aorta: There was minimal  atheroma in the descending aorta, but none was seen in the ascending aorta or arch. - Left atrium: No evidence of thrombus in the atrial cavity or appendage. - Right atrium: No evidence of thrombus in the atrial cavity or appendage. - Atrial septum: Echo contrast study showed no right-to-left atrial level shunt, at baseline or with provocation.  LDL 67 and A1C 5.9  Assessment: As you may recall, he is a 53 y.o. Caucasian male with PMH of HTN, HLD, CAD s/p CABG in 1997, questionable TIA 6-7 years ago was admitted  on 09/28/13 due to left frontal several small acute infarct consistent with embolic pattern. CTA showed bilateral ICA proximal soft plaques with left ICA 50% stenosis. His stroke most likely due to the soft plaque and stenosis. 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 His loop recorder so far negative. Will have his cardiologist Dr. Einar Gip take a look to see if he needs any procedure or repeat TEE.   Plan:  - continue plavix and crestor for stroke prevention - follow up with PCP for stroke risk factor modification - check BP at home - will discuss with Dr. Einar Gip regarding the significant of TEE findings (see above) - RTC in 2 months.  No orders of the defined types were placed in this encounter.    No orders of the defined types were placed in this encounter.    Patient Instructions  - continue plavix and crestor for stroke prevention - follow up with PCP for stroke risk factor modification - check BP at home for a week, record and see the trend.  - I will send a message to Dr. Einar Gip regarding your appointment with him. You need to see him ASAP regarding your finding in the transesophageal echocardiogram. If you do not hear from him in 2 weeks, please call Dr. Einar Gip for more information. - follow up in 2 months.   Rosalin Hawking, MD PhD [facility] [address], [place], [place] [address] [phone]

## 2013-11-22 NOTE — Patient Instructions (Signed)
-   continue plavix and crestor for stroke prevention - follow up with PCP for stroke risk factor modification - check BP at home for a week, record and see the trend.  - I will send a message to Dr. Einar Gip regarding your appointment with him. You need to see him ASAP regarding your finding in the transesophageal echocardiogram. If you do not hear from him in 2 weeks, please call Dr. Einar Gip for more information. - follow up in 2 months.

## 2013-11-23 ENCOUNTER — Telehealth: Payer: Self-pay | Admitting: *Deleted

## 2013-11-23 NOTE — Telephone Encounter (Signed)
I faxed over the information to  Dr. Jerene Bears .Marland KitchenOncology 11-23-2013

## 2013-11-23 NOTE — Telephone Encounter (Signed)
Message copied by Joellen Jersey on Tue Nov 23, 2013  4:34 PM ------      Message from: Rosalin Hawking      Created: Mon Nov 22, 2013  9:56 PM       Could you please send a copy to his PCP Dr. Woody Seller, [facility] Thanks  ------

## 2013-11-24 ENCOUNTER — Encounter: Payer: Self-pay | Admitting: Internal Medicine

## 2013-11-30 ENCOUNTER — Ambulatory Visit (INDEPENDENT_AMBULATORY_CARE_PROVIDER_SITE_OTHER): Payer: BC Managed Care – PPO | Admitting: *Deleted

## 2013-11-30 DIAGNOSIS — I639 Cerebral infarction, unspecified: Secondary | ICD-10-CM

## 2013-12-01 LAB — MDC_IDC_ENUM_SESS_TYPE_REMOTE
Date Time Interrogation Session: 20150915023117
MDC IDC SET ZONE DETECTION INTERVAL: 3000 ms
MDC IDC SET ZONE DETECTION INTERVAL: 340 ms
Zone Setting Detection Interval: 2000 ms

## 2013-12-03 NOTE — Progress Notes (Signed)
Loop recorder 

## 2013-12-23 ENCOUNTER — Encounter: Payer: Self-pay | Admitting: Internal Medicine

## 2013-12-30 ENCOUNTER — Ambulatory Visit (INDEPENDENT_AMBULATORY_CARE_PROVIDER_SITE_OTHER): Payer: BC Managed Care – PPO | Admitting: *Deleted

## 2013-12-30 DIAGNOSIS — I639 Cerebral infarction, unspecified: Secondary | ICD-10-CM

## 2014-01-04 HISTORY — PX: OTHER SURGICAL HISTORY: SHX169

## 2014-01-04 LAB — MDC_IDC_ENUM_SESS_TYPE_REMOTE

## 2014-01-05 NOTE — Progress Notes (Signed)
Loop recorder 

## 2014-01-19 DIAGNOSIS — Z87891 Personal history of nicotine dependence: Secondary | ICD-10-CM | POA: Insufficient documentation

## 2014-01-19 DIAGNOSIS — M6281 Muscle weakness (generalized): Secondary | ICD-10-CM | POA: Diagnosis present

## 2014-01-19 DIAGNOSIS — Z79899 Other long term (current) drug therapy: Secondary | ICD-10-CM | POA: Insufficient documentation

## 2014-01-19 DIAGNOSIS — G459 Transient cerebral ischemic attack, unspecified: Principal | ICD-10-CM | POA: Insufficient documentation

## 2014-01-19 DIAGNOSIS — Z951 Presence of aortocoronary bypass graft: Secondary | ICD-10-CM | POA: Diagnosis not present

## 2014-01-19 DIAGNOSIS — I10 Essential (primary) hypertension: Secondary | ICD-10-CM | POA: Insufficient documentation

## 2014-01-19 DIAGNOSIS — M109 Gout, unspecified: Secondary | ICD-10-CM | POA: Insufficient documentation

## 2014-01-19 DIAGNOSIS — E785 Hyperlipidemia, unspecified: Secondary | ICD-10-CM | POA: Insufficient documentation

## 2014-01-19 NOTE — ED Notes (Signed)
Pt c/o rt sided weakness and numbness that started at 2330. Pt states the weakness is gone at this time.

## 2014-01-20 ENCOUNTER — Observation Stay (HOSPITAL_COMMUNITY)
Admission: EM | Admit: 2014-01-20 | Discharge: 2014-01-20 | Disposition: A | Discharge status: Home / Self Care | Payer: BC Managed Care – PPO | Attending: Internal Medicine | Admitting: Internal Medicine

## 2014-01-20 ENCOUNTER — Emergency Department (HOSPITAL_COMMUNITY): Payer: BC Managed Care – PPO

## 2014-01-20 ENCOUNTER — Encounter (HOSPITAL_COMMUNITY): Payer: Self-pay | Admitting: Emergency Medicine

## 2014-01-20 ENCOUNTER — Observation Stay (HOSPITAL_COMMUNITY): Payer: BC Managed Care – PPO

## 2014-01-20 DIAGNOSIS — G459 Transient cerebral ischemic attack, unspecified: Secondary | ICD-10-CM | POA: Diagnosis present

## 2014-01-20 DIAGNOSIS — I359 Nonrheumatic aortic valve disorder, unspecified: Secondary | ICD-10-CM | POA: Diagnosis present

## 2014-01-20 DIAGNOSIS — M6281 Muscle weakness (generalized): Secondary | ICD-10-CM | POA: Diagnosis present

## 2014-01-20 DIAGNOSIS — G458 Other transient cerebral ischemic attacks and related syndromes: Secondary | ICD-10-CM

## 2014-01-20 DIAGNOSIS — I10 Essential (primary) hypertension: Secondary | ICD-10-CM | POA: Diagnosis present

## 2014-01-20 DIAGNOSIS — Z951 Presence of aortocoronary bypass graft: Secondary | ICD-10-CM

## 2014-01-20 DIAGNOSIS — Z95818 Presence of other cardiac implants and grafts: Secondary | ICD-10-CM | POA: Diagnosis present

## 2014-01-20 DIAGNOSIS — E785 Hyperlipidemia, unspecified: Secondary | ICD-10-CM | POA: Diagnosis present

## 2014-01-20 DIAGNOSIS — M6289 Other specified disorders of muscle: Secondary | ICD-10-CM

## 2014-01-20 HISTORY — DX: Personal history of transient ischemic attack (TIA), and cerebral infarction without residual deficits: Z86.73

## 2014-01-20 HISTORY — DX: Chronic sinusitis, unspecified: J32.9

## 2014-01-20 HISTORY — DX: Atherosclerotic heart disease of native coronary artery without angina pectoris: I25.10

## 2014-01-20 HISTORY — DX: Essential (primary) hypertension: I10

## 2014-01-20 LAB — COMPREHENSIVE METABOLIC PANEL
ALBUMIN: 4.1 g/dL (ref 3.5–5.2)
ALK PHOS: 91 U/L (ref 39–117)
ALT: 31 U/L (ref 0–53)
AST: 22 U/L (ref 0–37)
Anion gap: 15 (ref 5–15)
BILIRUBIN TOTAL: 0.3 mg/dL (ref 0.3–1.2)
BUN: 23 mg/dL (ref 6–23)
CO2: 22 mEq/L (ref 19–32)
Calcium: 9.4 mg/dL (ref 8.4–10.5)
Chloride: 100 mEq/L (ref 96–112)
Creatinine, Ser: 0.82 mg/dL (ref 0.50–1.35)
GFR calc Af Amer: >90 mL/min (ref >90)
GLUCOSE: 138 mg/dL — AB (ref 70–99)
Potassium: 3.7 mEq/L (ref 3.7–5.3)
Sodium: 137 mEq/L (ref 137–147)
Total Protein: 7.8 g/dL (ref 6.0–8.3)

## 2014-01-20 LAB — DIFFERENTIAL
Basophils Absolute: 0 10*3/uL (ref 0.0–0.1)
Basophils Relative: 0 % (ref 0–1)
EOS ABS: 0.3 10*3/uL (ref 0.0–0.7)
EOS PCT: 4 % (ref 0–5)
LYMPHS ABS: 2.6 10*3/uL (ref 0.7–4.0)
Lymphocytes Relative: 37 % (ref 12–46)
Monocytes Absolute: 0.6 10*3/uL (ref 0.1–1.0)
Monocytes Relative: 8 % (ref 3–12)
Neutro Abs: 3.6 10*3/uL (ref 1.7–7.7)
Neutrophils Relative %: 51 % (ref 43–77)

## 2014-01-20 LAB — URINALYSIS, ROUTINE W REFLEX MICROSCOPIC
Bilirubin Urine: NEGATIVE
Glucose, UA: NEGATIVE mg/dL
Hgb urine dipstick: NEGATIVE
Ketones, ur: NEGATIVE mg/dL
LEUKOCYTES UA: NEGATIVE
NITRITE: NEGATIVE
PROTEIN: NEGATIVE mg/dL
Specific Gravity, Urine: >1.03 — ABNORMAL HIGH (ref 1.005–1.030)
Urobilinogen, UA: 0.2 mg/dL (ref 0.0–1.0)
pH: 5 (ref 5.0–8.0)

## 2014-01-20 LAB — RAPID URINE DRUG SCREEN, HOSP PERFORMED
AMPHETAMINES: NOT DETECTED
BARBITURATES: NOT DETECTED
Benzodiazepines: NOT DETECTED
Cocaine: NOT DETECTED
Opiates: NOT DETECTED
TETRAHYDROCANNABINOL: NOT DETECTED

## 2014-01-20 LAB — TROPONIN I: Troponin I: <0.3 ng/mL (ref <0.30)

## 2014-01-20 LAB — HEMOGLOBIN A1C
HEMOGLOBIN A1C: 6.2 % — AB (ref <5.7)
MEAN PLASMA GLUCOSE: 131 mg/dL — AB (ref <117)

## 2014-01-20 LAB — LIPID PANEL
CHOL/HDL RATIO: 4.7 ratio
CHOLESTEROL: 155 mg/dL (ref 0–200)
HDL: 33 mg/dL — AB (ref >39)
LDL Cholesterol: 97 mg/dL (ref 0–99)
TRIGLYCERIDES: 124 mg/dL (ref <150)
VLDL: 25 mg/dL (ref 0–40)

## 2014-01-20 LAB — CBC
HCT: 43.5 % (ref 39.0–52.0)
Hemoglobin: 14.9 g/dL (ref 13.0–17.0)
MCH: 30.4 pg (ref 26.0–34.0)
MCHC: 34.3 g/dL (ref 30.0–36.0)
MCV: 88.8 fL (ref 78.0–100.0)
PLATELETS: 204 10*3/uL (ref 150–400)
RBC: 4.9 MIL/uL (ref 4.22–5.81)
RDW: 12.3 % (ref 11.5–15.5)
WBC: 7 10*3/uL (ref 4.0–10.5)

## 2014-01-20 LAB — APTT: aPTT: 32 seconds (ref 24–37)

## 2014-01-20 LAB — ETHANOL: Alcohol, Ethyl (B): <11 mg/dL (ref 0–11)

## 2014-01-20 LAB — PROTIME-INR
INR: 1.13 (ref 0.00–1.49)
PROTHROMBIN TIME: 14.6 s (ref 11.6–15.2)

## 2014-01-20 MED ORDER — INFLUENZA VAC SPLIT QUAD 0.5 ML IM SUSY
0.5000 mL | PREFILLED_SYRINGE | INTRAMUSCULAR | Status: DC
  Filled 2014-01-20: qty 0.5

## 2014-01-20 MED ORDER — GADOBENATE DIMEGLUMINE 529 MG/ML IV SOLN
20.0000 mL | Freq: Once | INTRAVENOUS | Status: AC | PRN
  Administered 2014-01-20: 20 mL via INTRAVENOUS

## 2014-01-20 MED ORDER — CALCIUM CARBONATE-VITAMIN D 500-200 MG-UNIT PO TABS
1.0000 | ORAL_TABLET | Freq: Every day | ORAL | Status: DC
  Administered 2014-01-20: 1 via ORAL
  Filled 2014-01-20: qty 1

## 2014-01-20 MED ORDER — SODIUM CHLORIDE 0.9 % IV SOLN: 250.0000 mL | INTRAVENOUS | Status: DC | PRN

## 2014-01-20 MED ORDER — EZETIMIBE 10 MG PO TABS
10.0000 mg | ORAL_TABLET | Freq: Every day | ORAL | Status: DC
  Administered 2014-01-20: 10 mg via ORAL
  Filled 2014-01-20: qty 1

## 2014-01-20 MED ORDER — ROSUVASTATIN CALCIUM 20 MG PO TABS
20.0000 mg | ORAL_TABLET | Freq: Every day | ORAL | Status: DC
  Administered 2014-01-20: 20 mg via ORAL
  Filled 2014-01-20: qty 1

## 2014-01-20 MED ORDER — METOPROLOL TARTRATE 25 MG PO TABS
25.0000 mg | ORAL_TABLET | Freq: Two times a day (BID) | ORAL | Status: DC
  Administered 2014-01-20 (×2): 25 mg via ORAL
  Filled 2014-01-20 (×2): qty 1

## 2014-01-20 MED ORDER — NITROGLYCERIN 0.4 MG SL SUBL: 0.4000 mg | SUBLINGUAL_TABLET | SUBLINGUAL | Status: DC | PRN

## 2014-01-20 MED ORDER — SODIUM CHLORIDE 0.9 % IJ SOLN
3.0000 mL | Freq: Two times a day (BID) | INTRAMUSCULAR | Status: DC
  Administered 2014-01-20 (×2): 3 mL via INTRAVENOUS

## 2014-01-20 MED ORDER — CLOPIDOGREL BISULFATE 75 MG PO TABS
75.0000 mg | ORAL_TABLET | Freq: Every day | ORAL | Status: DC
  Administered 2014-01-20: 75 mg via ORAL
  Filled 2014-01-20: qty 1

## 2014-01-20 MED ORDER — ENOXAPARIN SODIUM 40 MG/0.4ML ~~LOC~~ SOLN
40.0000 mg | SUBCUTANEOUS | Status: DC
  Administered 2014-01-20: 40 mg via SUBCUTANEOUS
  Filled 2014-01-20: qty 0.4

## 2014-01-20 MED ORDER — STROKE: EARLY STAGES OF RECOVERY BOOK
Freq: Once | Status: AC
  Administered 2014-01-20: 08:00:00
  Filled 2014-01-20: qty 1

## 2014-01-20 MED ORDER — SODIUM CHLORIDE 0.9 % IJ SOLN: 3.0000 mL | INTRAMUSCULAR | Status: DC | PRN

## 2014-01-20 NOTE — ED Provider Notes (Signed)
CSN: [id number]     Arrival date & time 01/19/14  2358 History  This chart was scribed for [employer]. Alvino Chapel, MD by Tula Nakayama, ED Scribe. This patient was seen in room APA04/APA04 and the patient's care was started at 12:08 AM.    Chief Complaint  Patient presents with  . Extremity Weakness   The history is provided by the patient. No language interpreter was used.    HPI Comments: Justin Sexton is a 53 y.o. male with a history of CVA who presents to the Emergency Department complaining of right-sided weakness that started in the arm and then went to leg and occurred 1 hour ago. He denies current weakness. Pt has a history of similar symptoms that occurred 3 months ago on the same side which was diagnosed as CVA. Pt reports that he was laying in bed at onset of symptoms. His wife notes that his right eye did not open fully. She also notes that pt is under a lot of stress because pt's son is currently in rehab in [place] and used pt's money to party. He denies confusion, numbness and difficulty talking as associated symptoms.   Past Medical History  Diagnosis Date  . Hypertension   . H/O coronary artery bypass surgery     16 years ago, [place]  . TIA (transient ischemic attack)   . Hyperlipidemia   . Gout    Past Surgical History  Procedure Laterality Date  . Colonoscopy N/A 09/03/2012    Procedure: COLONOSCOPY;  Surgeon: Rogene Houston, MD;  [facility];  Service: Endoscopy;  Laterality: N/A;  1200  . Coronary artery bypass graft    . Tee without cardioversion N/A 10/01/2013    Procedure: TRANSESOPHAGEAL ECHOCARDIOGRAM (TEE);  Surgeon: Sanda Klein, MD;  Location: [facility] ENDOSCOPY;  Service: Cardiovascular;  Laterality: N/A;  loop to follow   Family History  Problem Relation Age of Onset  . Hypertension Mother   . Hypertension Father    History  Substance Use Topics  . Smoking status: Former Research scientist (life sciences)  . Smokeless tobacco: Not on file  . Alcohol Use: No    Review  of Systems  Neurological: Positive for weakness. Negative for facial asymmetry and numbness.  Psychiatric/Behavioral: Negative for confusion.  All other systems reviewed and are negative.  Allergies  Codeine  Home Medications   Prior to Admission medications   Medication Sig Start Date End Date Taking? Authorizing Provider  calcium-vitamin D (OSCAL WITH D) 500-200 MG-UNIT per tablet Take 1 tablet by mouth daily.    Historical Provider, MD  clopidogrel (PLAVIX) 75 MG tablet Take 1 tablet (75 mg total) by mouth daily. 10/01/13   Maryann Mikhail, DO  ezetimibe (ZETIA) 10 MG tablet Take 10 mg by mouth daily.    Historical Provider, MD  fish oil-omega-3 fatty acids 1000 MG capsule Take 1 g by mouth daily.    Historical Provider, MD  levocetirizine (XYZAL) 5 MG tablet Take 5 mg by mouth daily. 09/22/13   Historical Provider, MD  metoprolol (LOPRESSOR) 50 MG tablet Take 25 mg by mouth 2 (two) times daily.    Historical Provider, MD  NITROSTAT 0.4 MG SL tablet Place 0.4 mg under the tongue every 5 (five) minutes as needed for chest pain.  09/23/13   Historical Provider, MD  rosuvastatin (CRESTOR) 20 MG tablet Take 20 mg by mouth daily.    Historical Provider, MD   BP 127/85 mmHg  Pulse 64  Temp(Src) 98.2 F (36.8  C) (Oral)  Resp 20  Ht 5\' 10"  (1.778 m)  Wt 272 lb 14.9 oz (123.8 kg)  BMI 39.16 kg/m2  SpO2 98% Physical Exam  Constitutional: He is oriented to person, place, and time. He appears well-developed. No distress.  HENT:  Head: Normocephalic and atraumatic.  Eyes: Conjunctivae and EOM are normal. Pupils are equal, round, and reactive to light.  Cardiovascular: Normal rate and regular rhythm.   Pulmonary/Chest: Effort normal. No stridor. No respiratory distress.  Abdominal: He exhibits no distension.  Musculoskeletal: He exhibits no edema.  Neurological: He is alert and oriented to person, place, and time.  Radial, medial and ulnar nerves intact. Finger-to-nose intact. Face symmetric.  Normal speech. Normal awareness. Good strength in all extremities.  Skin: Skin is warm and dry.  Psychiatric: He has a normal mood and affect.  Nursing note and vitals reviewed.   ED Course  Procedures   COORDINATION OF CARE: 12:10 AM Discussed treatment plan with pt which includes EKG and monitoring in the ED. Pt agreed to plan.  Labs Review Labs Reviewed  COMPREHENSIVE METABOLIC PANEL - Abnormal; Notable for the following:    Glucose, Bld 138 (*)    All other components within normal limits  ETHANOL  PROTIME-INR  APTT  CBC  DIFFERENTIAL  TROPONIN I  URINE RAPID DRUG SCREEN (HOSP PERFORMED)  URINALYSIS, ROUTINE W REFLEX MICROSCOPIC  HEMOGLOBIN A1C  LIPID PANEL    Imaging Review Ct Head Wo Contrast  01/20/2014   CLINICAL DATA:  Acute onset of right-sided weakness and numbness, now resolved. Initial encounter.  EXAM: CT HEAD WITHOUT CONTRAST  TECHNIQUE: Contiguous axial images were obtained from the base of the skull through the vertex without intravenous contrast.  COMPARISON:  CT/CTA of the head performed 09/29/2013, and MRI of the brain performed 09/28/2013  FINDINGS: There is no evidence of acute infarction, mass lesion, or intra- or extra-axial hemorrhage on CT.  The posterior fossa, including the cerebellum, brainstem and fourth ventricle, is within normal limits. The third and lateral ventricles, and basal ganglia are unremarkable in appearance. The cerebral hemispheres are symmetric in appearance, with normal gray-white differentiation. No mass effect or midline shift is seen.  There is no evidence of fracture; visualized osseous structures are unremarkable in appearance. The visualized portions of the orbits are within normal limits. The paranasal sinuses and mastoid air cells are well-aerated. No significant soft tissue abnormalities are seen.  IMPRESSION: Unremarkable noncontrast CT of the head.   Electronically Signed   By: Garald Balding M.D.   On: 01/20/2014 01:52      EKG Interpretation None      MDM   Final diagnoses:  Transient cerebral ischemia, unspecified transient cerebral ischemia type    Patient with right-sided weakness. Symptoms resolved before arrival. Previous episode of similar symptoms. Normal neurologic exam here. Previous episode was around 4 months ago and was a stroke. There was 1 questionable abnormality on MRI at that time. Also and potentially abnormal TEE. CT scan reassuring here. Will admit to internal medicine. At this point it is a TIA since symptoms have resolved. I personally performed the services described in this documentation, which was scribed in my presence. The recorded information has been reviewed and is accurate.      Jasper Riling. Alvino Chapel, MD 01/20/14 [phone]

## 2014-01-20 NOTE — Consult Note (Signed)
CARDIOLOGY CONSULT NOTE   Patient ID: Justin Sexton MRN: [id number] DOB/AGE: 11/11/1960 53 y.o.  Admit Date: 01/20/2014 Referring Physician: PTH Primary Physician: Glenda Chroman., MD Consulting Cardiologist: Satira Sark MD Primary Cardiologist: [facility]), Dr. Sherlynn Carbon Primary EP: Thompson Grayer, MD Reason for Consultation: Need to repeat TEE? Loop Recorder interrogation.   Clinical Summary Justin Sexton is a 53 y.o.male with history of CABG 1997, Hypertension, TIA, admitted with right-sided weakness which resolved after 20 minutes. He had a Medtronic Loop recorder placed by Dr. Rayann Heman in August in the setting of cryptogenic stroke. 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 We are asked to interrogate Loop Recorder for abnormalities, and discuss need to repeat TEE.   Of note, loop recorder was interrogated on 01/04/2014 revealing battery status OK. Normal device function. No symptom episodes, tachy episodes, brady or pause episodes.   Patient states that he took Nyquil last evening and went to bed as he had a head cold. While lying in bed he noticed ataxia of the right arm and right leg. No numbness or pain.He said it lasted about 5 minutes. Got up and the symptoms returned. He could bear wt on the right leg and move his right arm but had no coordination. His wife said that his right eye drooped. They called EMS. They state his BP was elevated 186/90. He did not received treatment in the field. He states that by time he got to [facility] his BP was normalized. He denies chest pain, dizziness, dyspnea or diaphoresis.   He states that when he had similar episode in August, he had been taking some NSAIDS for chronic back pain and had elevated BP at that time. 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   He has seen cardiologist in [place] with [facility] who did not think he needed to have repeat of TEE. He is also followed by neurologist. Follow up appt soon. He remains on Plavix and Crestor.  In ER, BP 136/85, labs essentially unremarkable, with the exception of mildly elevated glucose at 138. Potassium 3.7. CT of the head was unremarkable. EKG NSR with borderline left axis deviation. No evidence of WPW or Brugata's.   He is currently asymptomatic.  Allergies  Allergen Reactions  . Codeine Nausea And Vomiting    Medications Scheduled Medications: .  stroke: mapping our early stages of recovery book   Does not apply Once  . calcium-vitamin D  1 tablet Oral Daily  . clopidogrel  75 mg Oral Daily  . enoxaparin (LOVENOX) injection  40 mg Subcutaneous Q24H  . ezetimibe  10 mg Oral Daily  . [START ON 01/21/2014] Influenza vac split quadrivalent PF  0.5 mL Intramuscular Tomorrow-1000  . metoprolol  25 mg Oral BID  . rosuvastatin  20 mg Oral Daily  . sodium chloride  3 mL Intravenous Q12H    PRN Medications: sodium chloride, nitroGLYCERIN, sodium chloride   Past Medical History  Diagnosis Date  . Essential hypertension   . CAD (coronary artery disease), native coronary artery     CABG in 1996  . TIA (transient ischemic attack)   . Hyperlipidemia   . Gout   . History  of stroke     August 2015 - Small acute posterior left frontal lobe infarcts   . Chronic sinusitis     Past Surgical History  Procedure Laterality Date  . Colonoscopy N/A 09/03/2012    Procedure: COLONOSCOPY;  Surgeon: Rogene Houston, MD;  [facility];  Service: Endoscopy;  Laterality: N/A;  1200  . Coronary artery bypass graft  1997  . Tee without cardioversion N/A 10/01/2013    Procedure: TRANSESOPHAGEAL ECHOCARDIOGRAM (TEE);  Surgeon: Sanda Klein, MD;  Location: [facility] ENDOSCOPY;   Service: Cardiovascular;  Laterality: N/A;  loop to follow  . Implantable loop recorder  August 2015    Dr. Rayann Heman A Medtronic Reveal [place] model [place] SN [id number] implantable loop recorder was then placed into the pocket  R waves were very prominent and measured 0.43mV  . Loop recorder interrogation  Novemeber 17,  2015    [facility] summary report received. Battery status OK. Normal device function. No symptom episodes, tachy episodes, brady or pause episodes.     Family History  Problem Relation Age of Onset  . Hypertension Mother   . Hypertension Father     Social History Justin Sexton reports that he has quit smoking. His smoking use included Cigarettes. He smoked 0.00 packs per day. He does not have any smokeless tobacco history on file. Justin Sexton reports that he does not drink alcohol.  Review of Systems Complete review of systems are found to be negative unless outlined in H&P above.  Physical Examination Blood pressure 130/68, pulse 70, temperature 98.1 F (36.7 C), temperature source Oral, resp. rate 16, height 5\' 10"  (1.778 m), weight 272 lb 14.9 oz (123.8 kg), SpO2 96 %.  Intake/Output Summary (Last 24 hours) at 01/20/14 1013 Last data filed at 01/20/14 0919  Gross per 24 hour  Intake    240 ml  Output      0 ml  Net    240 ml    Telemetry:NSR rate in the 60's.   GEN:No acute distress HEENT: Conjunctiva and lids normal, oropharynx clear with moist mucosa. Neck: Supple, no elevated JVP or carotid bruits, no thyromegaly. Lungs: Clear to auscultation, nonlabored breathing at rest. Cardiac: Regular rate and rhythm, no S3 or significant systolic murmur, no pericardial rub. Abdomen: Soft, nontender, no hepatomegaly, bowel sounds present, no guarding or rebound. Extremities: No pitting edema, distal pulses 2+. Skin: Warm and dry. Musculoskeletal: No kyphosis. Neuropsychiatric: Alert and oriented x3, affect grossly appropriate.  Prior Cardiac Testing/Procedures 1. Carotid  Study 09/2013 Vascular Ultrasound Carotid Duplex (Doppler) has been completed.  Findings suggest upper range 1-39% right internal carotid artery stenosis and 40-59% left internal carotid artery stenosis. Vertebral arteries are patent with antegrade flow.  2. Loop Recorder: 10/02/2013 Medtronic model [id number] SN [id number]   3. CABG 1997   Lab Results  Basic Metabolic Panel:  Recent Labs Lab 01/20/14 0045  NA 137  K 3.7  CL 100  CO2 22  GLUCOSE 138*  BUN 23  CREATININE 0.82  CALCIUM 9.4    Liver Function Tests:  Recent Labs Lab 01/20/14 0045  AST 22  ALT 31  ALKPHOS 91  BILITOT 0.3  PROT 7.8  ALBUMIN 4.1    CBC:  Recent Labs Lab 01/20/14 0045  WBC 7.0  NEUTROABS 3.6  HGB 14.9  HCT 43.5  MCV 88.8  PLT 204    Cardiac Enzymes:  Recent Labs Lab 01/20/14 0045  TROPONINI <0.30    Radiology: Ct Head Wo Contrast  01/20/2014  CLINICAL DATA:  Acute onset of right-sided weakness and numbness, now resolved. Initial encounter.  EXAM: CT HEAD WITHOUT CONTRAST  TECHNIQUE: Contiguous axial images were obtained from the base of the skull through the vertex without intravenous contrast.  COMPARISON:  CT/CTA of the head performed 09/29/2013, and MRI of the brain performed 09/28/2013  FINDINGS: There is no evidence of acute infarction, mass lesion, or intra- or extra-axial hemorrhage on CT.  The posterior fossa, including the cerebellum, brainstem and fourth ventricle, is within normal limits. The third and lateral ventricles, and basal ganglia are unremarkable in appearance. The cerebral hemispheres are symmetric in appearance, with normal gray-white differentiation. No mass effect or midline shift is seen.  There is no evidence of fracture; visualized osseous structures are unremarkable in appearance. The visualized portions of the orbits are within normal limits. The paranasal sinuses and mastoid air cells are well-aerated. No significant soft tissue abnormalities are seen.   IMPRESSION: Unremarkable noncontrast CT of the head.   Electronically Signed   By: Garald Balding M.D.   On: 01/20/2014 01:52    ECG: NSR. Mild LAD.  Impression and Recommendations  1. Questionable TIA: He remains on Plavix and Crestor. Loop recorder is being interrogated today. Will defer to Dr. Domenic Polite need to repeat TEE or other testing.   2. Hypertensive: He was found to be hypertensive by EMS, but was normalized on arrival to ER. Review of home medicatons has him on metoprolol 50 mg BID.No hx of OSA. Did take Nyquil before symptoms occurred.   3. Hx of Loop Recorder: Last interrogated on Jan 04, 2014, but will have them do it again today as he has had new symptoms.   4. Hx of CABG: 1997. Requested records from [facility].    Signed: Phill Myron. Lawrence NP [place]  01/20/2014, 10:13 AM Co-Sign MD   Attending note:  Patient seen and examined. Reviewed extensive records including personal review of the TEE images from August 2015. Agree with above assessment by Ms. Lawrence NP. Justin Sexton presents after transient neurological symptoms, cannot exclude TIA. He is back to baseline now, brain MRI pending. He has a history of small acute posterior left frontal lobe infarct back in August 2015, no definitive cardioembolic source, had a Medtronic loop recorder placed at that time. So far device interrogation has shown no evidence of atrial fibrillation, telemetry today shows sinus rhythm. He has followed with Dr. Einar Gip in [place], more recently saw Dr. Hamilton Capri with the [facility] in [place] back in November.  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 No aortic regurgitation noted, and aortic valve trileaflet with normal excursion. 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 At this point no clear indication to repeat TEE. Would obtain a transthoracic study with special  attention to the aortic valve, mainly to make sure that there is no obvious change that would require further evaluation. We will otherwise have the loop recorder interrogated to make sure he had no transient atrial fibrillation in association with his symptoms yesterday.  Satira Sark, M.D., F.A.C.C.

## 2014-01-20 NOTE — Clinical Social Work Note (Signed)
CSW received consult for medication needs. CM notified. CSW signing off, but can be reconsulted if needed.  Benay Pike, [place]

## 2014-01-20 NOTE — Progress Notes (Signed)
    Primary cardiologist: Dr. Hamilton Capri ([facility] in [place])  Interrogation of implantable loop recorder shows no arrhythmias. Please see transthoracic echocardiogram report. No further cardiac testing planned at this time. Should keep regular follow-up with primary cardiologist.   Satira Sark, M.D., F.A.C.C.

## 2014-01-20 NOTE — H&P (Signed)
PCP:   Glenda Chroman., MD   Chief Complaint:  Right sided weakness  HPI 53 yo male with h/o recent same symptoms aug of this year comes in with right sided weakness that quickly resolved within 20 minutes.  This episode was not as severe as in august.  He had the below w/u please see neurology note from outpt follow up below.  He is on plavix.  Has loop recorder no abnormal recordings as of yet that i am aware of, this is being followed by [facility].  Was suppose to f/u with [facility] as outpt for possible repeat TEE due to the abnormality on his TEE in august which was thought to be a normal variant but this was unclear, his pcp however referred him to see cardiologist at [facility].  He saw a cardiology at [facility] in [place] about a month ago who reviewed his TEE results from [facility] and was told he did not need a repeat TEE and that it was indeed a normal variant and not a vegetation.  Pt and wife also report that neurology at [facility] tried to put him on a medication other than plavix but his insurance company denied covering it unless he had recurrent symptoms.   He denies neck pain, denies headache or vision changes.  Is currently feeling back to normal.    Per stroke neuro note outpt from 11/22/13:   "53 yo M with PMH of HTN, HLD, CAD s/p CABG in 1997, questionable TIA 6-7 years ago was admitted on 09/28/13 due to acute onset right arm and leg weakness which started to improve after 20-68min. When he was in ER, symptoms largely resolved. He did not get tPA. 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 His CTA neck showed bilateral soft plaques at proximal ICAs, especially on the left with 50% stenosis. TEE done showed a very small, filamentous mobile echodensity on the aortic valve, probably attached to the non coronary cusp. 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 He had loop recorder placed last  month. So far no Afib episodes. "  Review of Systems:  Positive and negative as per HPI otherwise all other systems are negative  Past Medical History: Past Medical History  Diagnosis Date  . Hypertension   . H/O coronary artery bypass surgery     16 years ago, [place]  . TIA (transient ischemic attack)   . Hyperlipidemia   . Gout    Past Surgical History  Procedure Laterality Date  . Colonoscopy N/A 09/03/2012    Procedure: COLONOSCOPY;  Surgeon: Rogene Houston, MD;  Location: [facility];  Service: Endoscopy;  Laterality: N/A;  1200  . Coronary artery bypass graft    . Tee without cardioversion N/A 10/01/2013    Procedure: TRANSESOPHAGEAL ECHOCARDIOGRAM (TEE);  Surgeon: Sanda Klein, MD;  Location: [facility] ENDOSCOPY;  Service: Cardiovascular;  Laterality: N/A;  loop to follow    Medications: Prior to Admission medications   Medication Sig Start Date End Date Taking? Authorizing Provider  calcium-vitamin D (OSCAL WITH D) 500-200 MG-UNIT per tablet Take 1 tablet by mouth daily.    Historical Provider, MD  clopidogrel (PLAVIX) 75 MG tablet Take 1 tablet (75 mg total) by mouth daily. 10/01/13   Maryann Mikhail, DO  ezetimibe (ZETIA) 10 MG tablet Take 10 mg by mouth daily.    Historical Provider, MD  fish oil-omega-3 fatty acids 1000 MG capsule Take 1 g  by mouth daily.    Historical Provider, MD  levocetirizine (XYZAL) 5 MG tablet Take 5 mg by mouth daily. 09/22/13   Historical Provider, MD  metoprolol (LOPRESSOR) 50 MG tablet Take 25 mg by mouth 2 (two) times daily.    Historical Provider, MD  NITROSTAT 0.4 MG SL tablet Place 0.4 mg under the tongue every 5 (five) minutes as needed for chest pain.  09/23/13   Historical Provider, MD  rosuvastatin (CRESTOR) 20 MG tablet Take 20 mg by mouth daily.    Historical Provider, MD    Allergies:   Allergies  Allergen Reactions  . Codeine Nausea And Vomiting    Social History:  reports that he has quit smoking. He does not have any  smokeless tobacco history on file. He reports that he does not drink alcohol or use illicit drugs.  Family History: Family History  Problem Relation Age of Onset  . Hypertension Mother   . Hypertension Father     Physical Exam: Filed Vitals:   01/20/14 0145 01/20/14 0200 01/20/14 0215 01/20/14 0230  BP:  128/75  131/79  Pulse: 65 63 64 58  Temp:      TempSrc:      Resp: 19 14 17 18   Height:      Weight:      SpO2: 96% 99% 97% 97%   General appearance: alert, cooperative and no distress Head: Normocephalic, without obvious abnormality, atraumatic Eyes: negative Nose: Nares normal. Septum midline. Mucosa normal. No drainage or sinus tenderness. Neck: no JVD and supple, symmetrical, trachea midline Lungs: clear to auscultation bilaterally Heart: regular rate and rhythm, S1, S2 normal, no murmur, click, rub or gallop Abdomen: soft, non-tender; bowel sounds normal; no masses,  no organomegaly Extremities: extremities normal, atraumatic, no cyanosis or edema Pulses: 2+ and symmetric Skin: Skin color, texture, turgor normal. No rashes or lesions Neurologic: Grossly normal    Labs on Admission:   Recent Labs  01/20/14 0045  NA 137  K 3.7  CL 100  CO2 22  GLUCOSE 138*  BUN 23  CREATININE 0.82  CALCIUM 9.4    Recent Labs  01/20/14 0045  AST 22  ALT 31  ALKPHOS 91  BILITOT 0.3  PROT 7.8  ALBUMIN 4.1    Recent Labs  01/20/14 0045  WBC 7.0  NEUTROABS 3.6  HGB 14.9  HCT 43.5  MCV 88.8  PLT 204    Recent Labs  01/20/14 0045  TROPONINI <0.30   Radiological Exams on Admission: Ct Head Wo Contrast  01/20/2014   CLINICAL DATA:  Acute onset of right-sided weakness and numbness, now resolved. Initial encounter.  EXAM: CT HEAD WITHOUT CONTRAST  TECHNIQUE: Contiguous axial images were obtained from the base of the skull through the vertex without intravenous contrast.  COMPARISON:  CT/CTA of the head performed 09/29/2013, and MRI of the brain performed  09/28/2013  FINDINGS: There is no evidence of acute infarction, mass lesion, or intra- or extra-axial hemorrhage on CT.  The posterior fossa, including the cerebellum, brainstem and fourth ventricle, is within normal limits. The third and lateral ventricles, and basal ganglia are unremarkable in appearance. The cerebral hemispheres are symmetric in appearance, with normal gray-white differentiation. No mass effect or midline shift is seen.  There is no evidence of fracture; visualized osseous structures are unremarkable in appearance. The visualized portions of the orbits are within normal limits. The paranasal sinuses and mastoid air cells are well-aerated. No significant soft tissue abnormalities are seen.  IMPRESSION: Unremarkable  noncontrast CT of the head.   Electronically Signed   By: Garald Balding M.D.   On: 01/20/2014 01:52    Assessment/Plan  54 yo male with recurrent tia like symptoms right sided weakness quickly resolved with recent h/o abnormality of aortic valve and ICA  Principal Problem:   TIA (transient ischemic attack)-  Will repeat mri/a of head and neck to include both intra and extracranial vessels.  Cont plavix for now.  Will consult both neurology and cardiology.  Cardiology to help arrange the need for repeat TEE.  Neurology for evaluation of changing medication course (on plavix) and recommendations on the results of repeat imaging which is pending.  ekg nsr, no arrythmias.  Active Problems:  Stable unless o/w noted   Essential hypertension, benign   Hyperlipidemia-  Taking crestor   Hypertension   Acute right-sided muscle weakness   Aortic valve defect per TEE in August normal variant vs vegetation??   Status post placement of implantable loop recorder-  Will need to have loop recorder data reviewed by cardiology in am.  obs on tele bed.  Full code.   Per neuro note outpt 11/22/13: "Plan:  - continue plavix and crestor for stroke prevention - follow up with PCP for  stroke risk factor modification - check BP at home - will discuss with Dr. Einar Gip regarding the significant of TEE findings (see above) - RTC in 2 months.  No orders of the defined types were placed in this encounter.   No orders of the defined types were placed in this encounter.   Patient Instructions  - continue plavix and crestor for stroke prevention - follow up with PCP for stroke risk factor modification - check BP at home for a week, record and see the trend.  - I will send a message to Dr. Einar Gip regarding your appointment with him. You need to see him ASAP regarding your finding in the transesophageal echocardiogram. If you do not hear from him in 2 weeks, please call Dr. Einar Gip for more information. - follow up in 2 months."   DAVID,RACHAL A 01/20/2014, 3:36 AM

## 2014-01-20 NOTE — Discharge Summary (Signed)
Physician Discharge Summary  Justin Sexton HCW:[id number] DOB: 1960/05/22 DOA: 01/20/2014  PCP: Glenda Chroman., MD  Admit date: 01/20/2014 Discharge date: 01/20/2014  Time spent: 40 minutes  Recommendations for Outpatient Follow-up:  1. 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 He had the  w/u please see neurology note from outpt follow up below. He is on plavix. Has loop recorder no abnormal recordings. Was suppose to f[facility] for possible repeat TEE due to the abnormality on his TEE in august which was thought to be a normal variant but this was unclear, his pcp however referred him to see cardiologist at [facility]. He saw a cardiology at [facility] in [place] about a month ago who reviewed [facility] and was told he did not need a repeat TEE and that it was indeed a normal variant and not a vegetation. Pt and wife also reported that [facility]y at [facility] tried to put him on a medication other than plavix but his insurance company denied covering it unless he had recurrent symptoms. He denied neck pain, denied headache or vision changes.   Hospital Course:  TIA (transient ischemic attack)- MRI/MRAbrain without  acute abnormality. Carotid doppler 8/15 Findings suggest upper range 1-39% right internal carotid artery stenosis and 40-59% left internal carotid artery stenosis. Vertebral arteries are patent with antegrade flow  Cont plavix for now.repeat echo with moderate LVH, EG 55-65% and grade 1 diastolic dysfunction. Evaluated by cardiology. Interrogation of loop recorder showed no arrhythmias. No further cardiac testing recommended. Needs to follow up with primary cardiology in 1 week. continue plavix. Follow up with primary neurology  Active Problems:   Essential hypertension, controlled  Hyperlipidemia- continue crestor      Procedures:  Echo 01/20/14   Wall thickness was increased in a pattern of moderate LVH. Systolic function was normal. The estimated ejection fraction was in the range of 55% to 65%. Wall motion was normal; there were no regional wall motion abnormalities. Doppler parameters are consistent with abnormal left ventricular relaxation (grade 1 diastolic dysfunction).  Consultations:  Dr Domenic Polite cardiology  Discharge Exam: Danley Danker Vitals:   01/20/14 1317  BP: 136/56  Pulse: 62  Temp: 98.3 F (36.8 C)  Resp: 18    General: well nourished NAD Cardiovascular: RRR No MGR trace LE edema PPP Respiratory: normal effort BS clear bilaterally no wheeze  Discharge Instructions You were cared for by a hospitalist during your hospital stay. If you have any questions about your discharge medications or the care you received while you were in the hospital after you are discharged, you can call the unit and asked to speak with the hospitalist on call if the hospitalist that took  care of you is not available. Once you are discharged, your primary care physician will handle any further medical issues. Please note that NO REFILLS for any discharge medications will be authorized once you are discharged, as it is imperative that you return to your primary care physician  (or establish a relationship with a primary care physician if you do not have one) for your aftercare needs so that they can reassess your need for medications and monitor your lab values.  Discharge Instructions    Diet - low sodium heart healthy    Complete by:  As directed      Increase activity slowly    Complete by:  As directed           Current Discharge Medication List    CONTINUE these medications which have NOT CHANGED   Details  calcium-vitamin D (OSCAL WITH D) 500-200 MG-UNIT per tablet Take 1 tablet by mouth daily.    clopidogrel (PLAVIX) 75 MG tablet Take 1 tablet (75 mg total) by mouth daily. Qty: 30 tablet, Refills: 0    ezetimibe (ZETIA) 10 MG tablet Take 10 mg by mouth daily.    fish oil-omega-3 fatty acids 1000 MG capsule Take 1 g by mouth daily.    levocetirizine (XYZAL) 5 MG tablet Take 5 mg by mouth daily.    metoprolol (LOPRESSOR) 50 MG tablet Take 25 mg by mouth 2 (two) times daily.    mometasone (NASONEX) 50 MCG/ACT nasal spray Place 2 sprays into both nostrils daily as needed.    naproxen sodium (ANAPROX) 220 MG tablet Take 440 mg by mouth 2 (two) times daily with a meal.    NITROSTAT 0.4 MG SL tablet Place 0.4 mg under the tongue every 5 (five) minutes as needed for chest pain.     Pseudoeph-Doxylamine-DM-APAP (NYQUIL PO) Take 30 mLs by mouth at bedtime as needed (cold).    ranitidine (ZANTAC) 150 MG capsule Take 150 mg by mouth daily.    rosuvastatin (CRESTOR) 40 MG tablet Take 40 mg by mouth every morning.       Allergies  Allergen Reactions  . Codeine Nausea And Vomiting   Follow-up Information    Follow up with VYAS,DHRUV B., MD. Schedule an appointment as soon as possible for a visit in 2 weeks.   Specialty:  Internal Medicine   Why:  for evaluation of symptoms   Contact information:   [address] [place] [address] [id number] [phone]       Follow up with [facility], JEFFREY C, MD.   Specialty:  Internal Medicine   Why:  cardiology at  [facility] information:   [address] [place] [address] [phone]       The results of significant diagnostics from this hospitalization (including imaging, microbiology, ancillary and laboratory) are listed below for reference.    Significant Diagnostic Studies: Ct Head Wo Contrast  01/20/2014   CLINICAL DATA:  Acute onset of right-sided weakness and numbness, now resolved. Initial encounter.  EXAM: CT HEAD WITHOUT CONTRAST  TECHNIQUE: Contiguous axial images were obtained from the base of the skull through the vertex without intravenous contrast.  COMPARISON:  CT/CTA of the head performed 09/29/2013, and MRI of the brain performed 09/28/2013  FINDINGS: There is no evidence of acute infarction, mass lesion, or intra- or extra-axial hemorrhage on CT.  The posterior fossa, including the cerebellum, brainstem and fourth ventricle, is within normal limits. The third and lateral ventricles, and basal ganglia are unremarkable  in appearance. The cerebral hemispheres are symmetric in appearance, with normal gray-white differentiation. No mass effect or midline shift is seen.  There is no evidence of fracture; visualized osseous structures are unremarkable in appearance. The visualized portions of the orbits are within normal limits. The paranasal sinuses and mastoid air cells are well-aerated. No significant soft tissue abnormalities are seen.  IMPRESSION: Unremarkable noncontrast CT of the head.   Electronically Signed   By: Garald Balding M.D.   On: 01/20/2014 01:52   Mr Jodene Nam Head Wo Contrast  01/20/2014   CLINICAL DATA:  Episode of right-sided weakness and numbness which has since resolved.  EXAM: MRA HEAD WITHOUT CONTRAST  TECHNIQUE: Angiographic images of the Circle of Willis were obtained using MRA technique without intravenous contrast.  COMPARISON:  MRA circle of Willis 09/28/2013.  FINDINGS: There is slight irregularity of the distal left petrous internal carotid artery  unchanged. Internal carotid arteries are otherwise within normal limits from the high cervical segments through the ICA termini bilaterally. The A1 and M1 segments are normal. Anterior communicating artery is patent. The MCA bifurcations are intact. There is mild attenuation of distal ACA and MCA branch vessels bilaterally.  The left vertebral artery is the dominant vessel. The PICA origins are visualized and normal bilaterally. Both posterior cerebral arteries originate from the basilar tip. There is moderate attenuation of distal PCA branch vessels bilaterally, worse on the left, likely exaggerated by patient motion.  IMPRESSION: 1. Minimal irregularity of the petrous left internal carotid artery is stable. This may be artifactual. There is no significant stenosis. 2. Mild distal small vessel disease is stable.   Electronically Signed   By: Lawrence Santiago M.D.   On: 01/20/2014 14:18   Mr Angiogram Neck W Wo Contrast  01/20/2014   CLINICAL DATA:  New onset of right-sided weakness and numbness which has since resolved.  EXAM: MRA NECK WITHOUT AND WITH CONTRAST  TECHNIQUE: Multiplanar and multiecho pulse sequences of the neck were obtained without and with intravenous contrast. Angiographic images of the neck were obtained using MRA technique without and with intravenous contrast.  CONTRAST:  27mL MULTIHANCE GADOBENATE DIMEGLUMINE 529 MG/ML IV SOLN  COMPARISON:  MRI brain from the same day.  FINDINGS: The time-of-flight images demonstrate no significant flow disturbance at either carotid bifurcation. Flow is antegrade within the vertebral arteries bilaterally.  A 3 vessel arch configuration is present. The vertebral arteries originate from the subclavian arteries bilaterally. Slight proximal signal loss is felt to be artifactual. The vertebral arteries are otherwise within normal limits. The PICA origins are visualized bilaterally and normal.  The right common carotid artery is within normal limits. Mild  irregularity in the proximal right internal carotid artery is likely related to atherosclerotic disease. The more distal cervical right ICA is normal.  Left common carotid artery is within normal limits. Bifurcation is unremarkable. The cervical left ICA is normal. There is slight irregularity of the petrous left ICA.  IMPRESSION: 1. Minimal atherosclerotic changes in the proximal right internal carotid artery without significant stenosis relative to the more distal vessel. 2. Slight irregularity of the petrous left internal carotid artery is confirmed, likely related to atherosclerotic disease. 3. No significant stenosis within the neck.   Electronically Signed   By: Lawrence Santiago M.D.   On: 01/20/2014 14:33   Mr Brain Wo Contrast  01/20/2014   CLINICAL DATA:  New onset of right-sided weakness and numbness which has since resolved.  EXAM: MRI HEAD WITHOUT CONTRAST  TECHNIQUE:  Multiplanar, multiecho pulse sequences of the brain and surrounding structures were obtained without intravenous contrast.  COMPARISON:  CT head without contrast from the same day. MRI brain 09/28/2013.  FINDINGS: The diffusion-weighted images demonstrate no evidence for acute or subacute infarction. Minimal periventricular white matter changes bilaterally are stable. The ventricles are of normal size. No significant extraaxial fluid collection is present. The small foci of acute infarction on the prior study are no longer evident.  Flow is present in the major intracranial arteries. The globes and orbits are intact. Mild mucosal thickening is evident in the ethmoid air cells and inferior maxillary sinuses bilaterally.  IMPRESSION: 1. No acute intracranial abnormality. 2. Stable minimal white matter disease.   Electronically Signed   By: Lawrence Santiago M.D.   On: 01/20/2014 14:13    Microbiology: No results found for this or any previous visit (from the past 240 hour(s)).   Labs: Basic Metabolic Panel:  Recent Labs Lab  01/20/14 0045  NA 137  K 3.7  CL 100  CO2 22  GLUCOSE 138*  BUN 23  CREATININE 0.82  CALCIUM 9.4   Liver Function Tests:  Recent Labs Lab 01/20/14 0045  AST 22  ALT 31  ALKPHOS 91  BILITOT 0.3  PROT 7.8  ALBUMIN 4.1   No results for input(s): LIPASE, AMYLASE in the last 168 hours. No results for input(s): AMMONIA in the last 168 hours. CBC:  Recent Labs Lab 01/20/14 0045  WBC 7.0  NEUTROABS 3.6  HGB 14.9  HCT 43.5  MCV 88.8  PLT 204   Cardiac Enzymes:  Recent Labs Lab 01/20/14 0045  TROPONINI <0.30   BNP: BNP (last 3 results) No results for input(s): PROBNP in the last 8760 hours. CBG: No results for input(s): GLUCAP in the last 168 hours.     SignedRadene Gunning  [facility] 01/20/2014, 3:11 PM

## 2014-01-20 NOTE — Progress Notes (Signed)
  Echocardiogram 2D Echocardiogram has been performed.  [place], [place] 01/20/2014, 11:12 AM

## 2014-01-20 NOTE — Progress Notes (Signed)
UR completed 

## 2014-01-20 NOTE — Care Management Note (Signed)
    Page 1 of 1   01/20/2014     1:48:48 PM CARE MANAGEMENT NOTE 01/20/2014  Patient:  Justin Sexton, Justin Sexton   Account Number:  [id number]  Date Initiated:  01/20/2014  Documentation initiated by:  Jolene Provost  Subjective/Objective Assessment:   Pt is from home, lives with wife and is independent with ADL's. Pt has no HH services, DME's or med needs prior to admission. Pt states he was unable to pya for "some medication the heart doctor wanted" because his insurance didn't cover it     Action/Plan:   Pt plans to discharge home with self care. No CM needs identified at this time.   Anticipated DC Date:  01/21/2014   Anticipated DC Plan:  [place]  CM consult      Choice offered to / List presented to:             Status of service:  Completed, signed off Medicare Important Message given?   (If response is "NO", the following Medicare IM given date fields will be blank) Date Medicare IM given:   Medicare IM given by:   Date Additional Medicare IM given:   Additional Medicare IM given by:    Discharge Disposition:  HOME/SELF CARE  Per UR Regulation:    If discussed at Long Length of Stay Meetings, dates discussed:    Comments:  01/20/2014 [place], RN, MSN, [facility]

## 2014-01-24 ENCOUNTER — Encounter: Payer: Self-pay | Admitting: Internal Medicine

## 2014-01-27 ENCOUNTER — Telehealth: Payer: Self-pay | Admitting: *Deleted

## 2014-01-27 ENCOUNTER — Encounter (HOSPITAL_COMMUNITY): Payer: Self-pay | Admitting: Internal Medicine

## 2014-01-27 NOTE — Telephone Encounter (Signed)
Called patient and left voice message to return my call in regards to his appointment.

## 2014-01-27 NOTE — Telephone Encounter (Signed)
Patient has been scheduled for 02/01/14 at 11 am.

## 2014-01-28 ENCOUNTER — Ambulatory Visit (INDEPENDENT_AMBULATORY_CARE_PROVIDER_SITE_OTHER): Payer: BC Managed Care – PPO | Admitting: *Deleted

## 2014-01-28 DIAGNOSIS — I639 Cerebral infarction, unspecified: Secondary | ICD-10-CM

## 2014-01-29 LAB — MDC_IDC_ENUM_SESS_TYPE_REMOTE
Date Time Interrogation Session: 20151215033621
MDC IDC SET ZONE DETECTION INTERVAL: 2000 ms
MDC IDC SET ZONE DETECTION INTERVAL: 3000 ms
Zone Setting Detection Interval: 340 ms

## 2014-01-31 ENCOUNTER — Ambulatory Visit: Payer: BC Managed Care – PPO | Admitting: Neurology

## 2014-02-01 ENCOUNTER — Encounter: Payer: Self-pay | Admitting: Neurology

## 2014-02-01 ENCOUNTER — Ambulatory Visit (INDEPENDENT_AMBULATORY_CARE_PROVIDER_SITE_OTHER): Payer: BC Managed Care – PPO | Admitting: Neurology

## 2014-02-01 VITALS — BP 146/86 | HR 56 | Ht 72.0 in | Wt 283.2 lb

## 2014-02-01 DIAGNOSIS — I639 Cerebral infarction, unspecified: Secondary | ICD-10-CM

## 2014-02-01 DIAGNOSIS — I10 Essential (primary) hypertension: Secondary | ICD-10-CM

## 2014-02-01 DIAGNOSIS — E785 Hyperlipidemia, unspecified: Secondary | ICD-10-CM

## 2014-02-01 MED ORDER — LISINOPRIL 10 MG PO TABS: 10.0000 mg | ORAL_TABLET | Freq: Every day | ORAL | Status: DC

## 2014-02-01 NOTE — Patient Instructions (Signed)
-   continue ASA and plavix for now - recommend to discontinue naproxen - will need to discuss with your cardiologist in [place] about stronger blood thinners - continue crestor for stroke prevention - your BP a little high today, will add lisinopril 10mg  to the regimen.  - Follow up with your primary care physician for stroke risk factor modification. Recommend maintain blood pressure goal <130/80, diabetes with hemoglobin A1c goal below 6.5% and lipids with LDL cholesterol goal below 70 mg/dL.  - follow up with cardiology for loop recorder - follow up in 2 months.

## 2014-02-01 NOTE — Progress Notes (Signed)
STROKE NEUROLOGY FOLLOW UP NOTE  NAME: Justin Sexton DOB: [birth year]  REASON FOR VISIT: stroke follow up HISTORY FROM: pt and chart  Today we had the pleasure of seeing Justin Sexton in follow-up at our [facility]. Pt was accompanied by no one.   History Summary 53 yo M with PMH of HTN, HLD, CAD s/p CABG in 1997, questionable TIA 6-7 years ago was admitted on 09/28/13 due to acute onset right arm and leg weakness which started to improve after 20-73min. When he was in ER, symptoms largely resolved. He did not get tPA. 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 His CTA neck showed bilateral soft plaques at proximal ICAs, especially on the left with 50% stenosis. TEE done showed a very small, filamentous mobile echodensity on the aortic valve, probably attached to the non coronary cusp. 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 He had loop recorder placed last month. So far no Afib episodes.   Follow up 11/22/13 -  the patient has been doing well. No recurrent symptoms. Continued on plavix and crestor for stroke prevention. BP today in clinic 141/85.  Interval History During the interval time, pt had another episode on 01/20/14 similar to the one in 09/2013 with right arm and leg weakness lasting about 20-30 min. At that time, he was in bed about to go to sleep and then he felt weakness. He stood up from bed and made sure that he did felt weakness. This episode was not as severe as the one in 09/2013. [facility] and was admitted for evaluation with CT and MRI which did not show acute stroke. 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 No further work up or medication change made. However, pt added ASA 81mg  by him own. Her loop recorder continued to show no afib episode.  I  have contacted his PCP Dr. Woody Seller during the interval time regarding cardiology referral. He went to see his cardiologist in [place] and he was told that the cardiologist may consider to change his blood thinners, but he can not remember details.   REVIEW OF SYSTEMS: Full 14 system review of systems performed and notable only for those listed below and in HPI above, all others are negative:  Constitutional: N/A  Cardiovascular: N/A  Ear/Nose/Throat: ringing in ears  Skin: N/A  Eyes: N/A  Respiratory: runny nose  Gastroitestinal: N/A  Genitourinary: N/A Hematology/Lymphatic: N/A  Endocrine: N/A  Musculoskeletal: N/A  Allergy/Immunology: N/A  Neurological: N/A  Psychiatric: N/A  The following represents the patient's updated allergies and side effects list: Allergies  Allergen Reactions  . Codeine Nausea And Vomiting    Labs since last visit of relevance include the following: Results for orders placed or performed during the hospital encounter of 01/20/14  Ethanol  Result Value Ref Range   Alcohol, Ethyl (B) <11 0 - 11 mg/dL  Protime-INR  Result Value Ref Range   Prothrombin Time 14.6 11.6 - 15.2 seconds   INR 1.13 0.00 - 1.49  APTT  Result Value Ref Range   aPTT 32 24 - 37 seconds  CBC  Result Value Ref Range   WBC 7.0 4.0 - 10.5 K/uL   RBC 4.90 4.22 - 5.81 MIL/uL   Hemoglobin 14.9 13.0 - 17.0 g/dL   HCT 43.5 39.0 - 52.0 %  MCV 88.8 78.0 - 100.0 fL   MCH 30.4 26.0 - 34.0 pg   MCHC 34.3 30.0 - 36.0 g/dL   RDW 12.3 11.5 - 15.5 %   Platelets 204 150 - 400 K/uL  Differential  Result Value Ref Range   Neutrophils Relative % 51 43 - 77 %   Neutro Abs 3.6 1.7 - 7.7 K/uL   Lymphocytes Relative 37 12 - 46 %   Lymphs Abs 2.6 0.7 - 4.0 K/uL   Monocytes Relative 8 3 - 12 %   Monocytes Absolute 0.6 0.1 - 1.0 K/uL   Eosinophils Relative 4 0 - 5 %   Eosinophils Absolute 0.3 0.0 - 0.7 K/uL   Basophils Relative 0 0 - 1 %   Basophils Absolute 0.0 0.0 - 0.1 K/uL  Comprehensive  metabolic panel  Result Value Ref Range   Sodium 137 137 - 147 mEq/L   Potassium 3.7 3.7 - 5.3 mEq/L   Chloride 100 96 - 112 mEq/L   CO2 22 19 - 32 mEq/L   Glucose, Bld 138 (H) 70 - 99 mg/dL   BUN 23 6 - 23 mg/dL   Creatinine, Ser 0.82 0.50 - 1.35 mg/dL   Calcium 9.4 8.4 - 10.5 mg/dL   Total Protein 7.8 6.0 - 8.3 g/dL   Albumin 4.1 3.5 - 5.2 g/dL   AST 22 0 - 37 U/L   ALT 31 0 - 53 U/L   Alkaline Phosphatase 91 39 - 117 U/L   Total Bilirubin 0.3 0.3 - 1.2 mg/dL   GFR calc non Af Amer >90 >90 mL/min   GFR calc Af Amer >90 >90 mL/min   Anion gap 15 5 - 15  Urine Drug Screen  Result Value Ref Range   Opiates NONE DETECTED NONE DETECTED   Cocaine NONE DETECTED NONE DETECTED   Benzodiazepines NONE DETECTED NONE DETECTED   Amphetamines NONE DETECTED NONE DETECTED   Tetrahydrocannabinol NONE DETECTED NONE DETECTED   Barbiturates NONE DETECTED NONE DETECTED  Urinalysis, Routine w reflex microscopic  Result Value Ref Range   Color, Urine YELLOW YELLOW   APPearance CLEAR CLEAR   Specific Gravity, Urine >1.030 (H) 1.005 - 1.030   pH 5.0 5.0 - 8.0   Glucose, UA NEGATIVE NEGATIVE mg/dL   Hgb urine dipstick NEGATIVE NEGATIVE   Bilirubin Urine NEGATIVE NEGATIVE   Ketones, ur NEGATIVE NEGATIVE mg/dL   Protein, ur NEGATIVE NEGATIVE mg/dL   Urobilinogen, UA 0.2 0.0 - 1.0 mg/dL   Nitrite NEGATIVE NEGATIVE   Leukocytes, UA NEGATIVE NEGATIVE  Troponin I  Result Value Ref Range   Troponin I <0.30 <0.30 ng/mL  Hemoglobin A1c  Result Value Ref Range   Hgb A1c MFr Bld 6.2 (H) <5.7 %   Mean Plasma Glucose 131 (H) <117 mg/dL  Lipid panel  Result Value Ref Range   Cholesterol 155 0 - 200 mg/dL   Triglycerides 124 <150 mg/dL   HDL 33 (L) >39 mg/dL   Total CHOL/HDL Ratio 4.7 RATIO   VLDL 25 0 - 40 mg/dL   LDL Cholesterol 97 0 - 99 mg/dL    The neurologically relevant items on the patient's problem list were reviewed on today's visit.  Neurologic Examination  A problem focused  neurological exam (12 or more points of the single system neurologic examination, vital signs counts as 1 point, cranial nerves count for 8 points) was performed.  Blood pressure 146/86, pulse 56, height 6' (1.829 m), weight 283 lb 3.2 oz (128.459 kg).  General -  Well nourished, well developed, in no apparent distress.  Ophthalmologic - Sharp disc margins OU.  Cardiovascular - Regular rate and rhythm with no murmur.  Mental Status -  Level of arousal and orientation to time, place, and person were intact. Language including expression, naming, repetition, comprehension, reading, and writing was assessed and found intact. Attention span and concentration were normal. Recent and remote memory were intact. Fund of Knowledge was assessed and was intact.  Cranial Nerves II - XII - II - Visual field intact OU. III, IV, VI - Extraocular movements intact. V - Facial sensation intact bilaterally. VII - Facial movement intact bilaterally. VIII - Hearing & vestibular intact bilaterally. X - Palate elevates symmetrically. XI - Chin turning & shoulder shrug intact bilaterally. XII - Tongue protrusion intact.  Motor Strength - The patient's strength was normal in all extremities and pronator drift was absent.  Bulk was normal and fasciculations were absent.   Motor Tone - Muscle tone was assessed at the neck and appendages and was normal.  Reflexes - The patient's reflexes were normal in all extremities and he had no pathological reflexes.  Sensory - Light touch, temperature/pinprick, vibration and proprioception, and Romberg testing were assessed and were normal.    Coordination - The patient had normal movements in the hands and feet with no ataxia or dysmetria.  Tremor was absent.  Gait and Station - The patient's transfers, posture, gait, station, and turns were observed as normal.  Data reviewed: I personally reviewed the images and agree with the radiology interpretations.  CT of the  brain 09/28/2013 Normal head CT with no acute intracranial process identified.  CT Angio Head & Neck 09/29/2013 1. Irregular calcified and noncalcified plaque within the proximal left internal carotid artery results an an irregular stenosis of slightly greater than 50%. 2. Smooth narrowing of the proximal right internal carotid artery without significant stenosis. 3. Mild atherosclerotic irregularity within the petrous left internal carotid artery without a significant stenosis. 4. Mild distal small vessel disease bilaterally. 5. No significant proximal stenosis, aneurysm, or branch vessel occlusion within the circle of Willis. 6. The high posterior left frontal lobe infarct is visualized with the aid of the prior MRI. There is no significant interval change.  MRI of the brain  09/28/2013 Small, acute posterior left frontal lobe infarcts.  01/20/14 - 1. No acute intracranial abnormality. 2. Stable minimal white matter disease. MRA of the brain  09/28/2013 New major intracranial arterial occlusion. 3. 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  01/20/14 - 1. Minimal irregularity of the petrous left internal carotid artery is stable. This may be artifactual. There is no significant Stenosis. 2. Mild distal small vessel disease is stable. MRA of the neck 01/20/14 - 1. Minimal atherosclerotic changes in the proximal right internal carotid artery without significant stenosis relative to the more distal vessel. 2. Slight irregularity of the petrous left internal carotid artery is confirmed, likely related to atherosclerotic disease. 3. No significant stenosis within the neck. Carotid Doppler Findings suggest upper range 1-39% right internal carotid artery stenosis and 40-59% left internal carotid artery stenosis. Vertebral arteries are patent with antegrade flow.  2D Echocardiogram  09/29/13 - EF 55-60% with no source of embolus. Normal  biventricular size and function. Impaired relaxation. Normal filling pressures. No significant valvular abnormalities. PASP can&'t be assessed on current study.  01/20/14 - Limited images. Moderate LVH with LVEF 16-10%, grade 1 diastolic dysfunction. No  clear aortic valvular masses or vegetations noted based on limited views. 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 No aortic regurgitation. Mildly ectatic aortic root. Trivialtricuspid regurgitation with PASP 31 mmHg. No obvious PFO or ASD. CXR 09/28/2013 There is no evidence of CHF nor pneumonia nor other acute cardiopulmonary abnormality.  EKG normal sinus rhythm.  TEE 10/01/13 - Left ventricle: Systolic function was normal. The estimated ejection fraction was in the range of 55% to 60%. Wall motion was normal; there were no regional wall motion abnormalities. - Aortic valve: There is a very small, filamentous mobile echodensity on the aortic valve, probably attached to the non coronary cusp. It prolapses into the LV outflow tract in diastole. 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 If no other etiology is identified for the patient&'s ischemic stroke, consider repeat TEE in a few months. - Aorta: There was minimal atheroma in the descending aorta, but none was seen in the ascending aorta or arch. - Left atrium: No evidence of thrombus in the atrial cavity or appendage. - Right atrium: No evidence of thrombus in the atrial cavity or appendage. - Atrial septum: Echo contrast study showed no right-to-left atrial level shunt, at baseline or with provocation.  LDL 67 and A1C 5.9  Assessment: As you may recall, he is a 53 y.o. Caucasian male with PMH of HTN, HLD, CAD s/p CABG in 1997, questionable TIA 6-7 years ago was admitted on 09/28/13 due to left frontal several small acute infarct consistent  with embolic pattern. CTA showed bilateral ICA proximal soft plaques with left ICA 50% stenosis. His stroke most likely due to the soft plaque and stenosis. 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 He was on plavix with statin. in 01/20/14 pt had another simpler episodes. MRI negative and his loop recorder so far negative. Although there is controversy regarding the treatment for Lambl&'s excresence, due to frequent symptoms and free mobile element on the TEE, I would incline to anticoagulation with coumadin. Will need to contact his cardiologist and make decision.   Plan:  - continue ASA and plavix and crestor for stroke prevention for now. - I will discuss with pt's cardiologist regarding recommendations on anticoagulation. - not recommended to use neproxen for pain. Tylenol is recommended. - BP not in good control, still high, add lisinopril 10mg  for BP control, check BP at home - Follow up with your primary care physician for stroke risk factor modification. Recommend maintain blood pressure goal <130/80, diabetes with hemoglobin A1c goal below 6.5% and lipids with LDL cholesterol goal below 70 mg/dL.  - follow up with cardiology for loop recorder.  - RTC in 2 months.  No orders of the defined types were placed in this encounter.    Meds ordered this encounter  Medications  . aspirin 81 MG EC tablet    Sig: Take 81 mg by mouth daily.  Marland Kitchen lisinopril (PRINIVIL,ZESTRIL) 10 MG tablet    Sig: Take 1 tablet (10 mg total) by mouth daily.    Dispense:  90 tablet    Refill:  3    Patient Instructions  - continue ASA and plavix for now - recommend to discontinue naproxen - will need to discuss with your cardiologist in [place] about stronger blood thinners - continue crestor for stroke prevention - your BP a little high today,  will add lisinopril 10mg  to the regimen.  - Follow up with your primary care  physician for stroke risk factor modification. Recommend maintain blood pressure goal <130/80, diabetes with hemoglobin A1c goal below 6.5% and lipids with LDL cholesterol goal below 70 mg/dL.  - follow up with cardiology for loop recorder - follow up in 2 months.    Rosalin Hawking, MD PhD [facility] [address], [place], [place] [address] [phone]

## 2014-02-02 ENCOUNTER — Telehealth: Payer: Self-pay | Admitting: Neurology

## 2014-02-02 NOTE — Telephone Encounter (Signed)
See note below

## 2014-02-02 NOTE — Progress Notes (Signed)
Loop recorder 

## 2014-02-02 NOTE — Telephone Encounter (Signed)
Patient calling with Name and phone number of Cardiologist, as requested by Dr. Erlinda Hong.  Dr. Almira Coaster [phone].  FYI

## 2014-02-03 NOTE — Telephone Encounter (Signed)
I called Dr. Dion Body office and left my cell phone for him to call back.  Rosalin Hawking, MD PhD Stroke Neurology 02/03/2014 6:40 PM

## 2014-02-04 NOTE — Telephone Encounter (Signed)
Discussed with Dr. Hamilton Capri and he is requesting the TEE report in hospital. Will fax over to him. But he agreed that even lambl's excrescence is a rare cause of stroke, but pt had recurrent TIA/stroke and lack of other etiology, coumadin or NOACs may be considered. I would anticoagulate him for 6 months and then repeat TEE. Dr. Hamilton Capri is happy to do TEE at that time.  Hi, Justin Sexton, could you please fax over the TEE report on 10/01/13 to Dr. Dion Body office? Thanks a lot.  Rosalin Hawking, MD PhD Stroke Neurology 02/04/2014 1:13 PM

## 2014-02-16 ENCOUNTER — Other Ambulatory Visit: Payer: Self-pay | Admitting: Neurology

## 2014-02-16 DIAGNOSIS — I639 Cerebral infarction, unspecified: Secondary | ICD-10-CM

## 2014-02-16 DIAGNOSIS — G459 Transient cerebral ischemic attack, unspecified: Secondary | ICD-10-CM

## 2014-02-16 MED ORDER — WARFARIN SODIUM 5 MG PO TABS: 5.0000 mg | ORAL_TABLET | Freq: Every day | ORAL | Status: DC

## 2014-02-16 NOTE — Progress Notes (Signed)
Informed pt over the phone that after discussion with his cardiologist Dr. Hamilton Capri, we decided to start anticoagulation coumadin for his previous CVA and TIA likely due to the lambl's excrescence seen on TEE in 09/2013. He was instructed to follow up with PCP Dr. Woody Seller for INR monitoring. Once INR 2-3, will stop ASA and plavix. Will repeat TEE in 6 months with Dr. Hamilton Capri to decide further need of anticoagulation. I have prescribed 5mg  coumadin daily to his pharmacy.  Rosalin Hawking, MD PhD Stroke Neurology 02/16/2014 4:48 PM

## 2014-02-17 NOTE — Progress Notes (Signed)
Message has been forwarded to Dr Woody Seller

## 2014-02-28 ENCOUNTER — Ambulatory Visit (INDEPENDENT_AMBULATORY_CARE_PROVIDER_SITE_OTHER): Payer: 59 | Admitting: *Deleted

## 2014-02-28 DIAGNOSIS — I639 Cerebral infarction, unspecified: Secondary | ICD-10-CM

## 2014-03-04 NOTE — Progress Notes (Signed)
Loop recorder 

## 2014-03-15 ENCOUNTER — Encounter: Payer: Self-pay | Admitting: Internal Medicine

## 2014-03-29 LAB — MDC_IDC_ENUM_SESS_TYPE_REMOTE

## 2014-03-30 ENCOUNTER — Ambulatory Visit (INDEPENDENT_AMBULATORY_CARE_PROVIDER_SITE_OTHER): Payer: 59 | Admitting: *Deleted

## 2014-03-30 DIAGNOSIS — I639 Cerebral infarction, unspecified: Secondary | ICD-10-CM | POA: Diagnosis not present

## 2014-03-31 LAB — MDC_IDC_ENUM_SESS_TYPE_REMOTE

## 2014-03-31 NOTE — Progress Notes (Signed)
Loop recorder 

## 2014-04-11 ENCOUNTER — Encounter: Payer: Self-pay | Admitting: Neurology

## 2014-04-11 ENCOUNTER — Ambulatory Visit (INDEPENDENT_AMBULATORY_CARE_PROVIDER_SITE_OTHER): Payer: 59 | Admitting: Neurology

## 2014-04-11 VITALS — BP 131/78 | HR 72 | Ht 70.0 in | Wt 282.2 lb

## 2014-04-11 DIAGNOSIS — I10 Essential (primary) hypertension: Secondary | ICD-10-CM

## 2014-04-11 DIAGNOSIS — I639 Cerebral infarction, unspecified: Secondary | ICD-10-CM

## 2014-04-11 DIAGNOSIS — I330 Acute and subacute infective endocarditis: Secondary | ICD-10-CM

## 2014-04-11 DIAGNOSIS — E785 Hyperlipidemia, unspecified: Secondary | ICD-10-CM

## 2014-04-11 MED ORDER — ASPIRIN EC 81 MG PO TBEC: 81.0000 mg | DELAYED_RELEASE_TABLET | Freq: Every day | ORAL | Status: DC

## 2014-04-11 NOTE — Patient Instructions (Signed)
-   continue coumadin for stroke prevention. INR goal 2-3 - switch from plavix to baby ASA to avoid higher risk of bleeding - Follow up with your primary care physician for stroke risk factor modification. Recommend maintain blood pressure goal <130/80, diabetes with hemoglobin A1c goal below 6.5% and lipids with LDL cholesterol goal below 70 mg/dL.  - call Dr. Aram Candela in April or May to schedule appointment for repeat TEE - check BP at home and avoid high BP - follow up in 3 months

## 2014-04-11 NOTE — Progress Notes (Signed)
STROKE NEUROLOGY FOLLOW UP NOTE  NAME: Justin Sexton DOB: August 13, 1960  REASON FOR VISIT: stroke follow up HISTORY FROM: pt and chart  Today we had the pleasure of seeing Justin Sexton in follow-up at our Neurology Clinic. Pt was accompanied by no one.   History Summary 54 yo M with PMH of HTN, HLD, CAD s/p CABG in 1997, questionable TIA 6-7 years ago was admitted on 09/28/13 due to acute onset right arm and leg weakness which started to improve after 20-24min. When he was in ER, symptoms largely resolved. He did not get tPA. 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 His CTA neck showed bilateral soft plaques at proximal ICAs, especially on the left with 50% stenosis. TEE done showed a very small, filamentous mobile echodensity on the aortic valve, probably attached to the non coronary cusp. 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 He had loop recorder placed last month. So far no Afib episodes.   Follow up 11/22/13 -  the patient has been doing well. No recurrent symptoms. Continued on plavix and crestor for stroke prevention. BP today in clinic 141/85.  Follow up 02/01/14 - During the interval time, pt had another episode on 01/20/14 similar to the one in 09/2013 with right arm and leg weakness lasting about 20-30 min. At that time, he was in bed about to go to sleep and then he felt weakness. He stood up from bed and made sure that he did felt weakness. This episode was not as severe as the one in 09/2013. [facility] and was admitted for evaluation with CT and MRI which did not show acute stroke. 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 No further work up or medication change made. However, pt added ASA 81mg  by him own. Her loop recorder continued to show no afib  episode.  I have contacted his PCP Dr. Woody Seller during the interval time regarding cardiology referral. He went to see his cardiologist in [place] and he was told that the cardiologist may consider to change his blood thinners, but he can not remember details.   Interval History During the interval time, pt was doing well without stroke like symptoms. I contacted his cardiologist in [facility] Dr. Aram Candela, who agrees with coumadin and agrees to repeat TEE in 6 months. He was put on coumadin and his INR is therapeutic. As per pt, last check was between 2-3. However, he is still on plavix with coumadin. His BP today in clinic 131/78. Loop recorder did not show afib episode.  REVIEW OF SYSTEMS: Full 14 system review of systems performed and notable only for those listed below and in HPI above, all others are negative:  Constitutional: N/A  Cardiovascular: N/A  Ear/Nose/Throat: ringing in ears  Skin: N/A  Eyes: N/A  Respiratory: runny nose  Gastroitestinal: N/A  Genitourinary: N/A Hematology/Lymphatic: N/A  Endocrine: N/A  Musculoskeletal: N/A  Allergy/Immunology: N/A  Neurological: N/A  Psychiatric: N/A  The following represents the patient's updated allergies and side effects list: Allergies  Allergen Reactions  . Codeine Nausea And Vomiting    Labs since last visit of relevance include the following: Results for orders placed or performed in visit on 03/30/14  Implantable device - remote  Result Value Ref Range   Pulse Generator Manufacturer Medtronic  Pulse Gen Model [id number] Reveal LINQ    Pulse Gen Serial Number [id number]    Eval Rhythm SR    Miscellaneous Comment      Carelink summary report received. Battery status OK. Normal device function. No symptom episodes, tachy episodes, brady or pause episodes. No AF episodes. Monthly summary reports and PRN with JA.    The neurologically relevant items on the patient's problem list were reviewed on today's visit.  Neurologic  Examination  A problem focused neurological exam (12 or more points of the single system neurologic examination, vital signs counts as 1 point, cranial nerves count for 8 points) was performed.  Blood pressure 131/78, pulse 72, height 5\' 10"  (1.778 m), weight 282 lb 3.2 oz (128.005 kg).  General - Well nourished, well developed, in no apparent distress.  Ophthalmologic - Sharp disc margins OU.  Cardiovascular - Regular rate and rhythm with no murmur.  Mental Status -  Level of arousal and orientation to time, place, and person were intact. Language including expression, naming, repetition, comprehension, reading, and writing was assessed and found intact. Attention span and concentration were normal. Recent and remote memory were intact. Fund of Knowledge was assessed and was intact.  Cranial Nerves II - XII - II - Visual field intact OU. III, IV, VI - Extraocular movements intact. V - Facial sensation intact bilaterally. VII - Facial movement intact bilaterally. VIII - Hearing & vestibular intact bilaterally. X - Palate elevates symmetrically. XI - Chin turning & shoulder shrug intact bilaterally. XII - Tongue protrusion intact.  Motor Strength - The patient's strength was normal in all extremities and pronator drift was absent.  Bulk was normal and fasciculations were absent.   Motor Tone - Muscle tone was assessed at the neck and appendages and was normal.  Reflexes - The patient's reflexes were normal in all extremities and he had no pathological reflexes.  Sensory - Light touch, temperature/pinprick, vibration and proprioception, and Romberg testing were assessed and were normal.    Coordination - The patient had normal movements in the hands and feet with no ataxia or dysmetria.  Tremor was absent.  Gait and Station - The patient's transfers, posture, gait, station, and turns were observed as normal.  Data reviewed: I personally reviewed the images and agree with the  radiology interpretations.  CT of the brain 09/28/2013 Normal head CT with no acute intracranial process identified.  CT Angio Head & Neck 09/29/2013 1. Irregular calcified and noncalcified plaque within the proximal left internal carotid artery results an an irregular stenosis of slightly greater than 50%. 2. Smooth narrowing of the proximal right internal carotid artery without significant stenosis. 3. Mild atherosclerotic irregularity within the petrous left internal carotid artery without a significant stenosis. 4. Mild distal small vessel disease bilaterally. 5. No significant proximal stenosis, aneurysm, or branch vessel occlusion within the circle of Willis. 6. The high posterior left frontal lobe infarct is visualized with the aid of the prior MRI. There is no significant interval change.  MRI of the brain  09/28/2013 Small, acute posterior left frontal lobe infarcts.  01/20/14 - 1. No acute intracranial abnormality. 2. Stable minimal white matter disease. MRA of the brain  09/28/2013 New major intracranial arterial occlusion. 3. 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  01/20/14 - 1. Minimal irregularity of the petrous left internal carotid artery is stable. This may be artifactual. There is no significant Stenosis. 2. Mild distal  small vessel disease is stable. MRA of the neck 01/20/14 - 1. Minimal atherosclerotic changes in the proximal right internal carotid artery without significant stenosis relative to the more distal vessel. 2. Slight irregularity of the petrous left internal carotid artery is confirmed, likely related to atherosclerotic disease. 3. No significant stenosis within the neck. Carotid Doppler Findings suggest upper range 1-39% right internal carotid artery stenosis and 40-59% left internal carotid artery stenosis. Vertebral arteries are patent with antegrade flow.  2D Echocardiogram  09/29/13 - EF 55-60%  with no source of embolus. Normal biventricular size and function. Impaired relaxation. Normal filling pressures. No significant valvular abnormalities. PASP can&'t be assessed on current study.  01/20/14 - Limited images. Moderate LVH with LVEF 82-95%, grade 1 diastolic dysfunction. No clear aortic valvular masses or vegetations noted based on limited views. 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 No aortic regurgitation. Mildly ectatic aortic root. Trivialtricuspid regurgitation with PASP 31 mmHg. No obvious PFO or ASD. CXR 09/28/2013 There is no evidence of CHF nor pneumonia nor other acute cardiopulmonary abnormality.  EKG normal sinus rhythm.  TEE 10/01/13 - Left ventricle: Systolic function was normal. The estimated ejection fraction was in the range of 55% to 60%. Wall motion was normal; there were no regional wall motion abnormalities. - Aortic valve: There is a very small, filamentous mobile echodensity on the aortic valve, probably attached to the non coronary cusp. It prolapses into the LV outflow tract in diastole. 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 If no other etiology is identified for the patient&'s ischemic stroke, consider repeat TEE in a few months. - Aorta: There was minimal atheroma in the descending aorta, but none was seen in the ascending aorta or arch. - Left atrium: No evidence of thrombus in the atrial cavity or appendage. - Right atrium: No evidence of thrombus in the atrial cavity or appendage. - Atrial septum: Echo contrast study showed no right-to-left atrial level shunt, at baseline or with provocation.  LDL 67 and A1C 5.9  Assessment: As you may recall, he is a 54 y.o. Caucasian male with PMH of HTN, HLD, CAD s/p CABG in 1997, questionable TIA 6-7 years ago was admitted on 09/28/13 due to left frontal  several small acute infarct consistent with embolic pattern. CTA showed bilateral ICA proximal soft plaques with left ICA 50% stenosis. His stroke most likely due to the soft plaque and stenosis. 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 He was on plavix with statin. in 01/20/14 pt had another simpler episodes. MRI negative and his loop recorder so far negative. Although there is controversy regarding the treatment for Lambl&'s excresence, due to frequent symptoms and free mobile element on the TEE, he was put on coumadin after discussion with his cardiologist. Will repeat TEE in 6 months. He is currently on coumadin and plavix. I would recommend coumadin and ASA 81 instead of plavix due to CAD.    Plan:  - continue coumadin for stroke prevention. INR goal 2-3 - change plavix to ASA 81mg  to decrease higher risk of bleeding - check BP at home - Follow up with your primary care physician for stroke risk factor modification. Recommend maintain blood pressure goal <130/80, diabetes with hemoglobin A1c goal below 6.5% and lipids with LDL cholesterol goal below 70 mg/dL.  - follow up with  cardiology to repeat TEE in 4 months.  - RTC in 3 months.  No orders of the defined types were placed in this encounter.    Meds ordered this encounter  Medications  . ascorbic acid (VITAMIN C) 500 MG tablet    Sig: Take 500 mg by mouth daily.  Marland Kitchen aspirin EC 81 MG tablet    Sig: Take 1 tablet (81 mg total) by mouth daily.    Dispense:  90 tablet    Refill:  3    Patient Instructions  - continue coumadin for stroke prevention. INR goal 2-3 - switch from plavix to baby ASA to avoid higher risk of bleeding - Follow up with your primary care physician for stroke risk factor modification. Recommend maintain blood pressure goal <130/80, diabetes with hemoglobin A1c goal below 6.5% and lipids with LDL cholesterol goal below 70 mg/dL.   - call Dr. Aram Candela in April or May to schedule appointment for repeat TEE - check BP at home and avoid high BP - follow up in 3 months   Rosalin Hawking, MD PhD [facility] [address] [place], [place] [address] [phone]

## 2014-04-14 ENCOUNTER — Encounter: Payer: Self-pay | Admitting: Internal Medicine

## 2014-04-22 ENCOUNTER — Encounter: Payer: Self-pay | Admitting: Internal Medicine

## 2014-04-29 ENCOUNTER — Ambulatory Visit (INDEPENDENT_AMBULATORY_CARE_PROVIDER_SITE_OTHER): Payer: 59 | Admitting: *Deleted

## 2014-04-29 DIAGNOSIS — I639 Cerebral infarction, unspecified: Secondary | ICD-10-CM

## 2014-04-29 LAB — MDC_IDC_ENUM_SESS_TYPE_REMOTE

## 2014-05-05 NOTE — Progress Notes (Signed)
Loop recorder 

## 2014-05-22 ENCOUNTER — Other Ambulatory Visit: Payer: Self-pay | Admitting: Neurology

## 2014-05-27 ENCOUNTER — Encounter: Payer: Self-pay | Admitting: Internal Medicine

## 2014-05-30 ENCOUNTER — Ambulatory Visit (INDEPENDENT_AMBULATORY_CARE_PROVIDER_SITE_OTHER): Payer: 59 | Admitting: *Deleted

## 2014-05-30 DIAGNOSIS — I639 Cerebral infarction, unspecified: Secondary | ICD-10-CM | POA: Diagnosis not present

## 2014-05-31 NOTE — Progress Notes (Signed)
Loop recorder 

## 2014-06-28 ENCOUNTER — Ambulatory Visit (INDEPENDENT_AMBULATORY_CARE_PROVIDER_SITE_OTHER): Payer: 59 | Admitting: *Deleted

## 2014-06-28 DIAGNOSIS — I639 Cerebral infarction, unspecified: Secondary | ICD-10-CM | POA: Diagnosis not present

## 2014-06-28 LAB — CUP PACEART REMOTE DEVICE CHECK
MDC IDC SESS DTM: 20160428200119
MDC IDC SET ZONE DETECTION INTERVAL: 3000 ms
Zone Setting Detection Interval: 2000 ms
Zone Setting Detection Interval: 340 ms

## 2014-07-01 NOTE — Progress Notes (Signed)
Loop recorder 

## 2014-07-18 ENCOUNTER — Emergency Department (HOSPITAL_COMMUNITY)
Admission: EM | Admit: 2014-07-18 | Discharge: 2014-07-19 | Disposition: A | Discharge status: Home / Self Care | Payer: 59 | Attending: Emergency Medicine | Admitting: Emergency Medicine

## 2014-07-18 ENCOUNTER — Encounter (HOSPITAL_COMMUNITY): Payer: Self-pay | Admitting: *Deleted

## 2014-07-18 ENCOUNTER — Emergency Department (HOSPITAL_COMMUNITY): Payer: 59

## 2014-07-18 DIAGNOSIS — Z8739 Personal history of other diseases of the musculoskeletal system and connective tissue: Secondary | ICD-10-CM | POA: Diagnosis not present

## 2014-07-18 DIAGNOSIS — Z79899 Other long term (current) drug therapy: Secondary | ICD-10-CM | POA: Diagnosis not present

## 2014-07-18 DIAGNOSIS — Z7901 Long term (current) use of anticoagulants: Secondary | ICD-10-CM | POA: Insufficient documentation

## 2014-07-18 DIAGNOSIS — Z87891 Personal history of nicotine dependence: Secondary | ICD-10-CM | POA: Diagnosis not present

## 2014-07-18 DIAGNOSIS — Z8709 Personal history of other diseases of the respiratory system: Secondary | ICD-10-CM | POA: Diagnosis not present

## 2014-07-18 DIAGNOSIS — Z951 Presence of aortocoronary bypass graft: Secondary | ICD-10-CM | POA: Diagnosis not present

## 2014-07-18 DIAGNOSIS — Z8673 Personal history of transient ischemic attack (TIA), and cerebral infarction without residual deficits: Secondary | ICD-10-CM | POA: Insufficient documentation

## 2014-07-18 DIAGNOSIS — Z7951 Long term (current) use of inhaled steroids: Secondary | ICD-10-CM | POA: Insufficient documentation

## 2014-07-18 DIAGNOSIS — I10 Essential (primary) hypertension: Secondary | ICD-10-CM | POA: Insufficient documentation

## 2014-07-18 DIAGNOSIS — E785 Hyperlipidemia, unspecified: Secondary | ICD-10-CM | POA: Diagnosis not present

## 2014-07-18 DIAGNOSIS — Z9889 Other specified postprocedural states: Secondary | ICD-10-CM | POA: Insufficient documentation

## 2014-07-18 DIAGNOSIS — R1032 Left lower quadrant pain: Secondary | ICD-10-CM | POA: Diagnosis present

## 2014-07-18 DIAGNOSIS — N200 Calculus of kidney: Secondary | ICD-10-CM | POA: Diagnosis not present

## 2014-07-18 DIAGNOSIS — R52 Pain, unspecified: Secondary | ICD-10-CM

## 2014-07-18 DIAGNOSIS — I2510 Atherosclerotic heart disease of native coronary artery without angina pectoris: Secondary | ICD-10-CM | POA: Insufficient documentation

## 2014-07-18 HISTORY — DX: Cerebral infarction, unspecified: I63.9

## 2014-07-18 LAB — CBC WITH DIFFERENTIAL/PLATELET
BASOS ABS: 0 10*3/uL (ref 0.0–0.1)
Basophils Relative: 0 % (ref 0–1)
Eosinophils Absolute: 0.1 10*3/uL (ref 0.0–0.7)
Eosinophils Relative: 1 % (ref 0–5)
HEMATOCRIT: 43.9 % (ref 39.0–52.0)
HEMOGLOBIN: 14.4 g/dL (ref 13.0–17.0)
Lymphocytes Relative: 18 % (ref 12–46)
Lymphs Abs: 1.9 10*3/uL (ref 0.7–4.0)
MCH: 29.5 pg (ref 26.0–34.0)
MCHC: 32.8 g/dL (ref 30.0–36.0)
MCV: 90 fL (ref 78.0–100.0)
Monocytes Absolute: 0.6 10*3/uL (ref 0.1–1.0)
Monocytes Relative: 5 % (ref 3–12)
Neutro Abs: 8.2 10*3/uL — ABNORMAL HIGH (ref 1.7–7.7)
Neutrophils Relative %: 76 % (ref 43–77)
PLATELETS: 213 10*3/uL (ref 150–400)
RBC: 4.88 MIL/uL (ref 4.22–5.81)
RDW: 12.4 % (ref 11.5–15.5)
WBC: 10.8 10*3/uL — ABNORMAL HIGH (ref 4.0–10.5)

## 2014-07-18 LAB — COMPREHENSIVE METABOLIC PANEL
ALT: 25 U/L (ref 17–63)
AST: 21 U/L (ref 15–41)
Albumin: 4.4 g/dL (ref 3.5–5.0)
Alkaline Phosphatase: 73 U/L (ref 38–126)
Anion gap: 11 (ref 5–15)
BUN: 23 mg/dL — ABNORMAL HIGH (ref 6–20)
CALCIUM: 8.8 mg/dL — AB (ref 8.9–10.3)
CO2: 23 mmol/L (ref 22–32)
CREATININE: 1.13 mg/dL (ref 0.61–1.24)
Chloride: 107 mmol/L (ref 101–111)
GFR calc non Af Amer: >60 mL/min (ref >60)
GLUCOSE: 130 mg/dL — AB (ref 65–99)
Potassium: 4 mmol/L (ref 3.5–5.1)
Sodium: 141 mmol/L (ref 135–145)
TOTAL PROTEIN: 7.6 g/dL (ref 6.5–8.1)
Total Bilirubin: 0.8 mg/dL (ref 0.3–1.2)

## 2014-07-18 MED ORDER — CIPROFLOXACIN HCL 250 MG PO TABS: 500.0000 mg | ORAL_TABLET | Freq: Once | ORAL | Status: DC

## 2014-07-18 MED ORDER — HYDROMORPHONE HCL 1 MG/ML IJ SOLN
0.5000 mg | Freq: Once | INTRAMUSCULAR | Status: AC
  Administered 2014-07-18: 0.5 mg via INTRAVENOUS
  Filled 2014-07-18: qty 1

## 2014-07-18 MED ORDER — PROMETHAZINE HCL 25 MG/ML IJ SOLN
INTRAMUSCULAR | Status: AC
  Administered 2014-07-18: 25 mg
  Filled 2014-07-18: qty 1

## 2014-07-18 MED ORDER — HYDROMORPHONE HCL 1 MG/ML IJ SOLN
0.5000 mg | Freq: Once | INTRAMUSCULAR | Status: AC
  Administered 2014-07-18: 0.5 mg via INTRAVENOUS

## 2014-07-18 MED ORDER — OXYCODONE-ACETAMINOPHEN 5-325 MG PO TABS: 1.0000 | ORAL_TABLET | Freq: Four times a day (QID) | ORAL | Status: DC | PRN

## 2014-07-18 MED ORDER — ONDANSETRON HCL 4 MG/2ML IJ SOLN
4.0000 mg | Freq: Once | INTRAMUSCULAR | Status: AC
  Administered 2014-07-18: 4 mg via INTRAVENOUS
  Filled 2014-07-18: qty 2

## 2014-07-18 MED ORDER — CIPROFLOXACIN HCL 500 MG PO TABS: 500.0000 mg | ORAL_TABLET | Freq: Two times a day (BID) | ORAL | Status: DC

## 2014-07-18 MED ORDER — TAMSULOSIN HCL 0.4 MG PO CAPS: 0.4000 mg | ORAL_CAPSULE | Freq: Once | ORAL | Status: DC

## 2014-07-18 MED ORDER — HYDROMORPHONE HCL 1 MG/ML IJ SOLN
INTRAMUSCULAR | Status: AC
  Filled 2014-07-18: qty 1

## 2014-07-18 MED ORDER — KETOROLAC TROMETHAMINE 30 MG/ML IJ SOLN
30.0000 mg | Freq: Once | INTRAMUSCULAR | Status: AC
  Administered 2014-07-18: 30 mg via INTRAVENOUS
  Filled 2014-07-18: qty 1

## 2014-07-18 MED ORDER — TAMSULOSIN HCL 0.4 MG PO CAPS: 0.4000 mg | ORAL_CAPSULE | Freq: Every day | ORAL | Status: DC

## 2014-07-18 MED ORDER — IOHEXOL 300 MG/ML  SOLN
100.0000 mL | Freq: Once | INTRAMUSCULAR | Status: AC | PRN
  Administered 2014-07-18: 100 mL via INTRAVENOUS

## 2014-07-18 MED ORDER — ONDANSETRON 4 MG PO TBDP: ORAL_TABLET | ORAL | Status: DC

## 2014-07-18 MED ORDER — SODIUM CHLORIDE 0.9 % IV BOLUS (SEPSIS)
500.0000 mL | Freq: Once | INTRAVENOUS | Status: AC
  Administered 2014-07-18: 22:00:00 via INTRAVENOUS

## 2014-07-18 NOTE — Discharge Instructions (Signed)
Follow up with [facility] this week. °

## 2014-07-18 NOTE — ED Provider Notes (Signed)
CSN: [id number]     Arrival date & time 07/18/14  2021 History  This chart was scribed for Justin Ferguson, MD by Hansel Feinstein, ED Scribe. This patient was seen in room [facility] and the patient's care was started at 9:27 PM.     Chief Complaint  Patient presents with  . Abdominal Pain   Patient is a 54 y.o. male presenting with abdominal pain. The history is provided by the patient. No language interpreter was used.  Abdominal Pain Pain location:  LLQ Pain radiates to:  Does not radiate Pain severity:  Moderate Duration:  5 hours Timing:  Constant Progression:  Worsening Chronicity:  New Relieved by:  None tried Worsened by:  Nothing tried Ineffective treatments:  None tried Associated symptoms: nausea   Associated symptoms: no chest pain, no cough, no diarrhea, no fatigue, no fever, no hematuria and no vomiting     HPI Comments: Justin Sexton is a 54 y.o. male who presents to the Emergency Department complaining of gradual onset, moderate LLQ abdominal pain onset 5 hours PTA. He reports associated nausea. The wife reports a recent tick bite that has since healed. Pt states that he has not had any similar Sx in the past. He denies Hx of diverticulitis. He also denies rashes, swelling, vomiting and fever.   Past Medical History  Diagnosis Date  . Essential hypertension   . CAD (coronary artery disease), native coronary artery 10/96    4 Vessel   . TIA (transient ischemic attack)   . Hyperlipidemia   . Gout   . History of stroke     August 2015 - Small acute posterior left frontal lobe infarcts   . Chronic sinusitis   . Stroke    Past Surgical History  Procedure Laterality Date  . Colonoscopy N/A 09/03/2012    Procedure: COLONOSCOPY;  Surgeon: Rogene Houston, MD;  Location: [facility];  Service: Endoscopy;  Laterality: N/A;  1200  . Coronary artery bypass graft  12/12/1994    LIMA to LAD, RIMA to mid RCA, reverse SVG to intermediate branch of left coronary artery.   Darden Dates  without cardioversion N/A 10/01/2013    Procedure: TRANSESOPHAGEAL ECHOCARDIOGRAM (TEE);  Surgeon: Sanda Klein, MD;  Location: [facility] ENDOSCOPY;  Service: Cardiovascular;  Laterality: N/A;  loop to follow  . Implantable loop recorder  August 2015    Dr. Rayann Heman A Medtronic Reveal [place] model [place] SN [id number] implantable loop recorder was then placed into the pocket  R waves were very prominent and measured 0.76mV  . Loop recorder interrogation  Novemeber 17,  2015    Carelink summary report received. Battery status OK. Normal device function. No symptom episodes, tachy episodes, brady or pause episodes.   . Loop recorder implant N/A 10/01/2013    Procedure: LOOP RECORDER IMPLANT;  Surgeon: Coralyn Mark, MD;  Location: [facility] CATH LAB;  Service: Cardiovascular;  Laterality: N/A;   Family History  Problem Relation Age of Onset  . Hypertension Mother   . Hypertension Father    History  Substance Use Topics  . Smoking status: Former Smoker    Types: Cigarettes  . Smokeless tobacco: Never Used  . Alcohol Use: No    Review of Systems  Constitutional: Negative for fever, appetite change and fatigue.  HENT: Negative for congestion, ear discharge and sinus pressure.   Eyes: Negative for discharge.  Respiratory: Negative for cough.   Cardiovascular: Negative for chest pain.  Gastrointestinal: Positive for nausea and abdominal pain.  Negative for vomiting and diarrhea.  Genitourinary: Negative for frequency and hematuria.  Musculoskeletal: Negative for back pain.  Skin: Negative for rash.  Neurological: Negative for seizures and headaches.  Psychiatric/Behavioral: Negative for hallucinations.    Allergies  Codeine  Home Medications   Prior to Admission medications   Medication Sig Start Date End Date Taking? Authorizing Provider  ascorbic acid (VITAMIN C) 500 MG tablet Take 500 mg by mouth daily.    Historical Provider, MD  ezetimibe (ZETIA) 10 MG tablet Take 10 mg by mouth daily.     Historical Provider, MD  levocetirizine (XYZAL) 5 MG tablet Take 5 mg by mouth daily. 09/22/13   Historical Provider, MD  lisinopril (PRINIVIL,ZESTRIL) 10 MG tablet Take 1 tablet (10 mg total) by mouth daily. 02/01/14   Rosalin Hawking, MD  metoprolol (LOPRESSOR) 50 MG tablet Take 25 mg by mouth 2 (two) times daily.    Historical Provider, MD  mometasone (NASONEX) 50 MCG/ACT nasal spray Place 2 sprays into both nostrils daily as needed.    Historical Provider, MD  NITROSTAT 0.4 MG SL tablet Place 0.4 mg under the tongue every 5 (five) minutes as needed for chest pain.  09/23/13   Historical Provider, MD  Pseudoeph-Doxylamine-DM-APAP (NYQUIL PO) Take 30 mLs by mouth at bedtime as needed (cold).    Historical Provider, MD  ranitidine (ZANTAC) 150 MG capsule Take 150 mg by mouth daily.    Historical Provider, MD  rosuvastatin (CRESTOR) 40 MG tablet Take 40 mg by mouth every morning.    Historical Provider, MD  warfarin (COUMADIN) 5 MG tablet TAKE ONE TABLET BY MOUTH ONCE DAILY. CONTACT DR VYAS FOR INR MONITORING. ONCE INR BETWEEN 2-3, PLEASE STOP ASPIRIN AND PLAVIX 05/23/14   Rosalin Hawking, MD   BP 141/68 mmHg  Pulse 67  Temp(Src) 98.1 F (36.7 C) (Oral)  Resp 18  Ht 5\' 10"  (1.778 m)  Wt 275 lb (124.739 kg)  BMI 39.46 kg/m2  SpO2 97% Physical Exam  Constitutional: He is oriented to person, place, and time. He appears well-developed.  HENT:  Head: Normocephalic.  Eyes: Conjunctivae and EOM are normal. No scleral icterus.  Neck: Neck supple. No thyromegaly present.  Cardiovascular: Normal rate and regular rhythm.  Exam reveals no gallop and no friction rub.   No murmur heard. Pulmonary/Chest: No stridor. He has no wheezes. He has no rales. He exhibits no tenderness.  Abdominal: He exhibits no distension. There is no tenderness. There is no rebound.  Mild LLQ tenderness.  Musculoskeletal: Normal range of motion. He exhibits no edema.  Lymphadenopathy:    He has no cervical adenopathy.  Neurological: He  is oriented to person, place, and time. He exhibits normal muscle tone. Coordination normal.  Skin: No rash noted. No erythema.  Psychiatric: He has a normal mood and affect. His behavior is normal.  Nursing note and vitals reviewed.   ED Course  Procedures (including critical care time) DIAGNOSTIC STUDIES: Oxygen Saturation is 97% on RA, normal by my interpretation.    COORDINATION OF CARE: 9:29 PM Discussed treatment plan with pt at bedside which includes lab work and CT A/P w/ contrast and pt agreed to plan.   Labs Review Labs Reviewed  CBC WITH DIFFERENTIAL/PLATELET - Abnormal; Notable for the following:    WBC 10.8 (*)    Neutro Abs 8.2 (*)    All other components within normal limits  COMPREHENSIVE METABOLIC PANEL    Imaging Review No results found.   EKG Interpretation None  MDM   Final diagnoses:  None   Kidney stone.  Pt given zofran, narotics and flomax and is to  See urology  The chart was scribed for me under my direct supervision.  I personally performed the history, physical, and medical decision making and all procedures in the evaluation of this patient.Justin Ferguson, MD 07/21/14 [phone]

## 2014-07-18 NOTE — ED Notes (Signed)
LLQ  Pain     N/v no diarrhea.  No fever.   No UTI sx.

## 2014-07-19 LAB — CUP PACEART REMOTE DEVICE CHECK
MDC IDC SESS DTM: 20160515120026
MDC IDC SET ZONE DETECTION INTERVAL: 3000 ms
Zone Setting Detection Interval: 2000 ms
Zone Setting Detection Interval: 340 ms

## 2014-07-20 ENCOUNTER — Encounter: Payer: Self-pay | Admitting: Neurology

## 2014-07-20 ENCOUNTER — Ambulatory Visit (INDEPENDENT_AMBULATORY_CARE_PROVIDER_SITE_OTHER): Payer: 59 | Admitting: Neurology

## 2014-07-20 VITALS — BP 141/81 | HR 66 | Wt 282.0 lb

## 2014-07-20 DIAGNOSIS — E785 Hyperlipidemia, unspecified: Secondary | ICD-10-CM

## 2014-07-20 DIAGNOSIS — I639 Cerebral infarction, unspecified: Secondary | ICD-10-CM

## 2014-07-20 DIAGNOSIS — I10 Essential (primary) hypertension: Secondary | ICD-10-CM | POA: Diagnosis not present

## 2014-07-20 DIAGNOSIS — I330 Acute and subacute infective endocarditis: Secondary | ICD-10-CM

## 2014-07-20 NOTE — Progress Notes (Signed)
STROKE NEUROLOGY FOLLOW UP NOTE  NAME: Justin Sexton DOB: 03-10-1960  REASON FOR VISIT: stroke follow up HISTORY FROM: pt and chart  Today we had the pleasure of seeing Justin Sexton in follow-up at our Neurology Clinic. Pt was accompanied by no one.   History Summary 54 yo M with PMH of HTN, HLD, CAD s/p CABG in 1997, questionable TIA 6-7 years ago was admitted on 09/28/13 due to acute onset right arm and leg weakness which started to improve after 20-72min. When he was in ER, symptoms largely resolved. He did not get tPA. 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 His CTA neck showed bilateral soft plaques at proximal ICAs, especially on the left with 50% stenosis. TEE done showed a very small, filamentous mobile echodensity on the aortic valve, probably attached to the non coronary cusp. 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 He had loop recorder placed last month. So far no Afib episodes.   Follow up 11/22/13 -  the patient has been doing well. No recurrent symptoms. Continued on plavix and crestor for stroke prevention. BP today in clinic 141/85.  Follow up 02/01/14 - During the interval time, pt had another episode on 01/20/14 similar to the one in 09/2013 with right arm and leg weakness lasting about 20-30 min. At that time, he was in bed about to go to sleep and then he felt weakness. He stood up from bed and made sure that he did felt weakness. This episode was not as severe as the one in 09/2013. [facility] and was admitted for evaluation with CT and MRI which did not show acute stroke. 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 No further work up or medication change made. However, pt added ASA 81mg  by him own. Her loop recorder continued to show no afib  episode.  I have contacted his PCP Dr. Woody Seller during the interval time regarding cardiology referral. He went to see his cardiologist in [place] and he was told that the cardiologist may consider to change his blood thinners, but he can not remember details.   Follow up 04/11/14 - pt was doing well without stroke like symptoms. I contacted his cardiologist in [facility] Dr. Aram Candela, who agrees with coumadin and agrees to repeat TEE in 6 months. He was put on coumadin and his INR is therapeutic. As per pt, last check was between 2-3. However, he is still on plavix with coumadin. His BP today in clinic 131/78. Loop recorder did not show afib episode.  Interval History During the interval time, pt has been doing well without recurrent stroke like symptoms. His INR last check subtherapeutic as he ate a lot of vegetables. He will have INR check this Friday. He seems also has appointment with cardiology this month for repeat TEE but he needs to call for confirmation. BP today 141/81. Still on coumadin and ASA 81mg , no side effect.   REVIEW OF SYSTEMS: Full 14 system review of systems performed and notable only for those listed below and in HPI above, all others are negative:  Constitutional: N/A  Cardiovascular: N/A  Ear/Nose/Throat:   Skin: N/A  Eyes: N/A  Respiratory:   Gastroitestinal: N/A  Genitourinary: N/A Hematology/Lymphatic: N/A  Endocrine: N/A  Musculoskeletal: N/A  Allergy/Immunology: N/A  Neurological: N/A  Psychiatric: N/A  The following represents the patient's updated allergies and side effects list: Allergies  Allergen Reactions  . Codeine Nausea And Vomiting    Labs since last visit of relevance include the following: Results for orders placed or performed during the hospital encounter of 07/18/14  CBC with Differential  Result Value Ref Range   WBC 10.8 (H) 4.0 - 10.5 K/uL   RBC 4.88 4.22 - 5.81 MIL/uL   Hemoglobin 14.4 13.0 - 17.0 g/dL   HCT 43.9 39.0 - 52.0 %   MCV  90.0 78.0 - 100.0 fL   MCH 29.5 26.0 - 34.0 pg   MCHC 32.8 30.0 - 36.0 g/dL   RDW 12.4 11.5 - 15.5 %   Platelets 213 150 - 400 K/uL   Neutrophils Relative % 76 43 - 77 %   Neutro Abs 8.2 (H) 1.7 - 7.7 K/uL   Lymphocytes Relative 18 12 - 46 %   Lymphs Abs 1.9 0.7 - 4.0 K/uL   Monocytes Relative 5 3 - 12 %   Monocytes Absolute 0.6 0.1 - 1.0 K/uL   Eosinophils Relative 1 0 - 5 %   Eosinophils Absolute 0.1 0.0 - 0.7 K/uL   Basophils Relative 0 0 - 1 %   Basophils Absolute 0.0 0.0 - 0.1 K/uL  Comprehensive metabolic panel  Result Value Ref Range   Sodium 141 135 - 145 mmol/L   Potassium 4.0 3.5 - 5.1 mmol/L   Chloride 107 101 - 111 mmol/L   CO2 23 22 - 32 mmol/L   Glucose, Bld 130 (H) 65 - 99 mg/dL   BUN 23 (H) 6 - 20 mg/dL   Creatinine, Ser 1.13 0.61 - 1.24 mg/dL   Calcium 8.8 (L) 8.9 - 10.3 mg/dL   Total Protein 7.6 6.5 - 8.1 g/dL   Albumin 4.4 3.5 - 5.0 g/dL   AST 21 15 - 41 U/L   ALT 25 17 - 63 U/L   Alkaline Phosphatase 73 38 - 126 U/L   Total Bilirubin 0.8 0.3 - 1.2 mg/dL   GFR calc non Af Amer >60 >60 mL/min   GFR calc Af Amer >60 >60 mL/min   Anion gap 11 5 - 15    The neurologically relevant items on the patient's problem list were reviewed on today's visit.  Neurologic Examination  A problem focused neurological exam (12 or more points of the single system neurologic examination, vital signs counts as 1 point, cranial nerves count for 8 points) was performed.  Blood pressure 141/81, pulse 66, weight 282 lb (127.914 kg).  General - Well nourished, well developed, in no apparent distress.  Ophthalmologic - Sharp disc margins OU.  Cardiovascular - Regular rate and rhythm with no murmur.  Mental Status -  Level of arousal and orientation to time, place, and person were intact. Language including expression, naming, repetition, comprehension, reading, and writing was assessed and found intact. Attention span and concentration were normal. Recent and remote memory  were intact. Fund of Knowledge was assessed and was intact.  Cranial Nerves II - XII - II - Visual field intact OU. III, IV, VI - Extraocular movements intact. V - Facial sensation intact bilaterally. VII - Facial movement intact bilaterally. VIII - Hearing & vestibular intact bilaterally. X - Palate elevates symmetrically. XI - Chin turning & shoulder shrug intact bilaterally. XII - Tongue protrusion intact.  Motor Strength - The patient's strength was normal in all extremities and pronator drift was absent.  Bulk was normal and fasciculations were absent.  Motor Tone - Muscle tone was assessed at the neck and appendages and was normal.  Reflexes - The patient's reflexes were normal in all extremities and he had no pathological reflexes.  Sensory - Light touch, temperature/pinprick, vibration and proprioception, and Romberg testing were assessed and were normal.    Coordination - The patient had normal movements in the hands and feet with no ataxia or dysmetria.  Tremor was absent.  Gait and Station - The patient's transfers, posture, gait, station, and turns were observed as normal.  Data reviewed: I personally reviewed the images and agree with the radiology interpretations.  CT of the brain 09/28/2013 Normal head CT with no acute intracranial process identified.  CT Angio Head & Neck 09/29/2013 1. Irregular calcified and noncalcified plaque within the proximal left internal carotid artery results an an irregular stenosis of slightly greater than 50%. 2. Smooth narrowing of the proximal right internal carotid artery without significant stenosis. 3. Mild atherosclerotic irregularity within the petrous left internal carotid artery without a significant stenosis. 4. Mild distal small vessel disease bilaterally. 5. No significant proximal stenosis, aneurysm, or branch vessel occlusion within the circle of Willis. 6. The high posterior left frontal lobe infarct is visualized with the aid of  the prior MRI. There is no significant interval change.  MRI of the brain  09/28/2013 Small, acute posterior left frontal lobe infarcts.  01/20/14 - 1. No acute intracranial abnormality. 2. Stable minimal white matter disease. MRA of the brain  09/28/2013 New major intracranial arterial occlusion. 3. 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  01/20/14 - 1. Minimal irregularity of the petrous left internal carotid artery is stable. This may be artifactual. There is no significant Stenosis. 2. Mild distal small vessel disease is stable. MRA of the neck 01/20/14 - 1. Minimal atherosclerotic changes in the proximal right internal carotid artery without significant stenosis relative to the more distal vessel. 2. Slight irregularity of the petrous left internal carotid artery is confirmed, likely related to atherosclerotic disease. 3. No significant stenosis within the neck. Carotid Doppler Findings suggest upper range 1-39% right internal carotid artery stenosis and 40-59% left internal carotid artery stenosis. Vertebral arteries are patent with antegrade flow.  2D Echocardiogram  09/29/13 - EF 55-60% with no source of embolus. Normal biventricular size and function. Impaired relaxation. Normal filling pressures. No significant valvular abnormalities. PASP can&'t be assessed on current study.  01/20/14 - Limited images. Moderate LVH with LVEF 10-93%, grade 1 diastolic dysfunction. No clear aortic valvular masses or vegetations noted based on limited views. 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 No aortic regurgitation. Mildly ectatic aortic root. Trivialtricuspid regurgitation with PASP 31 mmHg. No obvious PFO or ASD. CXR 09/28/2013 There is no evidence of CHF nor pneumonia nor other acute cardiopulmonary abnormality.  EKG normal sinus rhythm.  TEE 10/01/13 - Left  ventricle: Systolic function was normal. The estimated ejection fraction was in the range of 55% to 60%. Wall motion was normal; there were no regional wall motion abnormalities. - Aortic valve: There is a very small, filamentous mobile echodensity on the aortic valve, probably attached to the non coronary cusp. It prolapses into the LV outflow tract in diastole. 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 If no other etiology is identified for the patient&'s ischemic stroke, consider repeat TEE in  a few months. - Aorta: There was minimal atheroma in the descending aorta, but none was seen in the ascending aorta or arch. - Left atrium: No evidence of thrombus in the atrial cavity or appendage. - Right atrium: No evidence of thrombus in the atrial cavity or appendage. - Atrial septum: Echo contrast study showed no right-to-left atrial level shunt, at baseline or with provocation.  LDL 67 and A1C 5.9  Assessment: As you may recall, he is a 54 y.o. Caucasian male with PMH of HTN, HLD, CAD s/p CABG in 1997, questionable TIA 6-7 years ago was admitted on 09/28/13 due to left frontal several small acute infarct consistent with embolic pattern. CTA showed bilateral ICA proximal soft plaques with left ICA 50% stenosis. His stroke most likely due to the soft plaque and stenosis. 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 He was on plavix with statin. in 01/20/14 pt had another simpler episodes. MRI negative and his loop recorder so far negative. Although there is controversy regarding the treatment for Lambl&'s excresence, due to frequent symptoms and free mobile element on the TEE, he was put on coumadin after discussion with his cardiologist. Need to repeat TEE in 07/2014. He was on coumadin and plavix which was changed to for CAD to  reduce bleeding risk.    Plan:  - continue coumadin for stroke prevention. INR goal 2-3 - continue ASA 81 for cardiac prevention - Follow up with your primary care physician for stroke risk factor modification. Recommend maintain blood pressure goal <130/80, diabetes with hemoglobin A1c goal below 6.5% and lipids with LDL cholesterol goal below 70 mg/dL.  - Call Dr. Aram Candela to confirm the appointment for repeat TEE - check BP at home and avoid high BP is 1 - RTC in 4 months  No orders of the defined types were placed in this encounter.    Meds ordered this encounter  Medications  . ciprofloxacin (CIPRO) 500 MG tablet    Sig:   . tamsulosin (FLOMAX) 0.4 MG CAPS capsule    Sig:   . ondansetron (ZOFRAN-ODT) 4 MG disintegrating tablet    Sig:     Patient Instructions  - continue coumadin for stroke prevention. INR goal 2-3 - continue ASA 81 for cardiac prevention - Follow up with your primary care physician for stroke risk factor modification. Recommend maintain blood pressure goal <130/80, diabetes with hemoglobin A1c goal below 6.5% and lipids with LDL cholesterol goal below 70 mg/dL.  - Call Dr. Aram Candela to confirm the appointment for repeat TEE - check BP at home and avoid high BP - follow up in 4 months   Rosalin Hawking, MD PhD [facility] [address] [place], [place] [address] [phone]

## 2014-07-20 NOTE — Patient Instructions (Signed)
-   continue coumadin for stroke prevention. INR goal 2-3 - continue ASA 81 for cardiac prevention - Follow up with your primary care physician for stroke risk factor modification. Recommend maintain blood pressure goal <130/80, diabetes with hemoglobin A1c goal below 6.5% and lipids with LDL cholesterol goal below 70 mg/dL.  - Call Dr. Aram Candela to confirm the appointment for repeat TEE - check BP at home and avoid high BP - follow up in 4 months

## 2014-07-21 ENCOUNTER — Encounter: Payer: Self-pay | Admitting: Internal Medicine

## 2014-07-28 ENCOUNTER — Ambulatory Visit (INDEPENDENT_AMBULATORY_CARE_PROVIDER_SITE_OTHER): Payer: 59 | Admitting: *Deleted

## 2014-07-28 DIAGNOSIS — I639 Cerebral infarction, unspecified: Secondary | ICD-10-CM

## 2014-07-29 NOTE — Progress Notes (Signed)
Loop recorder 

## 2014-08-01 LAB — CUP PACEART REMOTE DEVICE CHECK: MDC IDC SESS DTM: 20160613154713

## 2014-08-03 ENCOUNTER — Encounter: Payer: Self-pay | Admitting: Internal Medicine

## 2014-08-03 MED FILL — Oxycodone w/ Acetaminophen Tab 5-325 MG: ORAL | Qty: 6 | Status: AC

## 2014-08-16 ENCOUNTER — Encounter: Payer: Self-pay | Admitting: Internal Medicine

## 2014-08-26 ENCOUNTER — Ambulatory Visit (INDEPENDENT_AMBULATORY_CARE_PROVIDER_SITE_OTHER): Payer: 59 | Admitting: *Deleted

## 2014-08-26 DIAGNOSIS — I639 Cerebral infarction, unspecified: Secondary | ICD-10-CM

## 2014-08-30 NOTE — Progress Notes (Signed)
Loop recorder 

## 2014-08-31 ENCOUNTER — Telehealth: Payer: Self-pay | Admitting: Neurology

## 2014-08-31 NOTE — Telephone Encounter (Signed)
Miranda NP called and requested to speak with Dr. Erlinda Hong regarding the patient. The patient is currently under her care in the hospital due to a new stroke. Please call and advise.

## 2014-08-31 NOTE — Telephone Encounter (Signed)
Discussed with Miranda over the phone. She stated that pt Monday 08/29/14 had acute onset right side weakness which has been resolved. However, MRI showed left side tiny infarct. He was on Coumadin, however INR 1.2, subtherapeutic. His CTA showed left ICA 50% stenosis which was the same as in 09/2013. Currently, they will do TEE tomorrow to re-evaluate previous aortic valve Lembo excrescence, and he was already put on Coumadin bridging with the Lovenox.Due to financial issues, he may not be able to have NOACs. Will see him when he comes back. I appreciated Miranda for letting me know.   Rosalin Hawking, MD PhD Stroke Neurology 08/31/2014 5:49 PM

## 2014-09-06 ENCOUNTER — Telehealth: Payer: Self-pay | Admitting: Neurology

## 2014-09-06 NOTE — Telephone Encounter (Signed)
Spoke with Justin Sexton the patient back and got no answer, left a voice mail asking him to return call.

## 2014-09-06 NOTE — Telephone Encounter (Signed)
Patients wife called and stated that the patient had a stroke last week and that his PCP wants him to see Dr. Leonie Man before his next apt with Dr. Erlinda Hong. She would like to speak with someone regarding this. Please call and advise.

## 2014-09-06 NOTE — Telephone Encounter (Signed)
Patients wife would like for him to be seen ASAP and would like to know if there is any way he can be worked in.

## 2014-09-07 ENCOUNTER — Telehealth: Payer: Self-pay | Admitting: Neurology

## 2014-09-07 NOTE — Telephone Encounter (Signed)
I called ins at [phone].  Spoke with Raheem.  He was not able to assist with medication info and asked that I call [phone].  I called this number.  Spoke with Cam.  He said this medication is covered, however the co-pay would be $150 per month.   Patient may be able to sign up for discount voucher at [email or web address].  If he is eligible (there is a short enrollment patient will need to complete), he may be able to get this medication for as little as $10 per fill.   Please let me know if any additional info is needed.  Thank you!

## 2014-09-07 NOTE — Telephone Encounter (Signed)
Thanks, Jesscia. I have talked to pt and he is going to visit [email or web address] to see if he is eligible for $10 per fill. Otherwise, he will pay $150 per month.   Rosalin Hawking, MD PhD Stroke Neurology 09/07/2014 5:00 PM

## 2014-09-07 NOTE — Telephone Encounter (Signed)
Talked with wife over the phone and she stated that she and pt want to follow up with me. She stated that pt's lovenox may be have another 5 days, so I checked my calender and I think I just have overbook with him next Monday 09/12/14 at 10:30am. Wife expressed understanding and appreciation.   Larey Seat, could you please overbook him next Monday 09/12/14 at 10:30am with me? Thanks. Also, he was admitted to [facility] in [place] last week, I talked with the NP at that hospital last week over the phone. She said we can see their record via [place] since they can see our record. However, I checked out [place] but they say "no information received and no requests have been made". How can we do that? If not able to do that, could you please request medical record from that hospital. I need to see what they have done for the pt, especially the MRI report, and TEE report. Thanks a lot.   Hi, Jessica, this pt may need to switch from coumadin to Eliquis. He has [employer]. Could you please check how much he has to pay for that? Thanks much.  Rosalin Hawking, MD PhD Stroke Neurology 09/07/2014 12:05 PM

## 2014-09-07 NOTE — Telephone Encounter (Signed)
Pt's wife called and states coumandin level is 2.1, they want to know if they should keep taking the pill plus another medication enoxaparin-Lovanox 100mg  injection. Please call and advise. [phone]

## 2014-09-07 NOTE — Telephone Encounter (Signed)
Thank you, Casandra, I am able to see the reports from [facility]. Talked with pt over the phone and he said his lovenox discontinued as his INR 2.1 this am.   Rosalin Hawking, MD PhD Stroke Neurology 09/07/2014 4:58 PM

## 2014-09-07 NOTE — Telephone Encounter (Signed)
Patient's wife is returning a call regarding the patient. °

## 2014-09-07 NOTE — Telephone Encounter (Signed)
Hi Dr. Erlinda Hong- I scheduled the appt for this patient on  09/12/14 at 10:30a.m as requested. I went to [facility] and Caryl Asp was able to show me how to "request an update" for the patient's new info at [facility]. It looks like the information (MRI and TEE results) that you need for this patient are available now. If it is not, or you're having trouble viewing it then the patient will need to sign a medical records release form and I would fax it to [facility].

## 2014-09-07 NOTE — Telephone Encounter (Signed)
Spoke to spouse. Spouse states patient had a stroke while in [place] last week and was advised by MD in [place] to follow up with neurology in 1 week. Spouse says MD in [place] spoke to Dr. Erlinda Hong about patient. Pt saw PCP Dr. Woody Seller this week who recommended pt sees Dr. Leonie Man this week since he is in outpatient and Dr. Erlinda Hong in hospital. Advised spouse I would message Dr. Erlinda Hong to advise. Spouse agreed.

## 2014-09-08 ENCOUNTER — Telehealth: Payer: Self-pay

## 2014-09-08 NOTE — Telephone Encounter (Signed)
Spoke with patients wife and she verbalized understanding, husband has spoken with nurse from coumadin clinic

## 2014-09-08 NOTE — Telephone Encounter (Signed)
Please let the wife know that I have already talked with her husband yesterday afternoon that since INR 2.1, he should continue the coumadin only and stop the lovenox injection. Her husband knows that since this is exactly coumadin clinic told him to do. They are also increasing his coumadin dose to achieve higher INR levels. Pt knows the plan and he is following with the instructions. She can discuss with her husband, if there is further questions, please let her call back and I am happy to answer them. Thanks for your help.  Rosalin Hawking, MD PhD Stroke Neurology 09/08/2014 3:46 PM

## 2014-09-12 ENCOUNTER — Encounter: Payer: Self-pay | Admitting: Neurology

## 2014-09-12 ENCOUNTER — Ambulatory Visit (INDEPENDENT_AMBULATORY_CARE_PROVIDER_SITE_OTHER): Payer: 59 | Admitting: Neurology

## 2014-09-12 VITALS — BP 136/79 | HR 57 | Ht 70.0 in | Wt 277.6 lb

## 2014-09-12 DIAGNOSIS — I10 Essential (primary) hypertension: Secondary | ICD-10-CM

## 2014-09-12 DIAGNOSIS — I6523 Occlusion and stenosis of bilateral carotid arteries: Secondary | ICD-10-CM | POA: Diagnosis not present

## 2014-09-12 DIAGNOSIS — I639 Cerebral infarction, unspecified: Secondary | ICD-10-CM

## 2014-09-12 DIAGNOSIS — I6529 Occlusion and stenosis of unspecified carotid artery: Secondary | ICD-10-CM | POA: Insufficient documentation

## 2014-09-12 DIAGNOSIS — E785 Hyperlipidemia, unspecified: Secondary | ICD-10-CM | POA: Diagnosis not present

## 2014-09-12 MED ORDER — ASPIRIN EC 81 MG PO TBEC: 81.0000 mg | DELAYED_RELEASE_TABLET | Freq: Every day | ORAL | Status: DC

## 2014-09-12 MED ORDER — CLOPIDOGREL BISULFATE 75 MG PO TABS: 75.0000 mg | ORAL_TABLET | Freq: Every day | ORAL | Status: DC

## 2014-09-12 NOTE — Progress Notes (Signed)
STROKE NEUROLOGY FOLLOW UP NOTE  NAME: Justin Sexton DOB: 12/30/1960  REASON FOR VISIT: stroke follow up HISTORY FROM: pt and chart  Today we had the pleasure of seeing Justin Sexton in follow-up at our Neurology Clinic. Pt was accompanied by no one.   History Summary 54 yo M with PMH of HTN, HLD, CAD s/p CABG in 1997, questionable TIA 6-7 years ago was admitted on 09/28/13 due to acute onset right arm and leg weakness which started to improve after 20-26min. When he was in ER, symptoms largely resolved. He did not get tPA. 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 His CTA neck showed bilateral soft plaques at proximal ICAs, especially on the left with 50% stenosis. TEE done showed a very small, filamentous mobile echodensity on the aortic valve, probably attached to the non coronary cusp. 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 He had loop recorder placed last month. So far no Afib episodes.   Follow up 11/22/13 -  the patient has been doing well. No recurrent symptoms. Continued on plavix and crestor for stroke prevention. BP today in clinic 141/85.  Follow up 02/01/14 - During the interval time, pt had another episode on 01/20/14 similar to the one in 09/2013 with right arm and leg weakness lasting about 20-30 min. At that time, he was in bed about to go to sleep and then he felt weakness. He stood up from bed and made sure that he did felt weakness. This episode was not as severe as the one in 09/2013. [facility] and was admitted for evaluation with CT and MRI which did not show acute stroke. 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 No further work up or medication change made. However, pt added ASA 81mg  by him own. Her loop recorder continued to show no afib  episode.  I have contacted his PCP Dr. Woody Seller during the interval time regarding cardiology referral. He went to see his cardiologist in [place] and he was told that the cardiologist may consider to change his blood thinners, but he can not remember details.   Follow up 04/11/14 - pt was doing well without stroke like symptoms. I contacted his cardiologist in [facility] Dr. Aram Candela, who agrees with coumadin and agrees to repeat TEE in 6 months. He was put on coumadin and his INR is therapeutic. As per pt, last check was between 2-3. However, he is still on plavix with coumadin. His BP today in clinic 131/78. Loop recorder did not show afib episode.  Follow up 07/20/14 - pt has been doing well without recurrent stroke like symptoms. His INR last check subtherapeutic as he ate a lot of vegetables. He will have INR check this Friday. He seems also has appointment with cardiology this month for repeat TEE but he needs to call for confirmation. BP today 141/81. Still on coumadin and ASA 81mg , no side effect.   Interval History During the interval time, pt was admitted in [facility] in [place] on 08/29/14 for acute onset right leg and right arm weakness. The episode lasted about 20 minutes, then resolved. He felt some residual coordination issues afterwards. INR 1.2 on admission. MRI showed 3 punctate infarcts at left posterior frontal, corona radiata/caudate body adjacent left lateral ventricle. Repeat TEE normal with disappearance of previous vegetation.  CTA neck showed right ICA 50% stenosis and left ICA less than 50% stenosis but with plaque. He was discharged with coumadin and ASA bridging with lovenox. On discharge his INR 2.1.  After discharge, he was back to [place] and followed up with PCP. Continued on coumadin and ASA. Felt left arm and leg some subtle weakness but not on exam. His BP 136/79.  REVIEW OF SYSTEMS: Full 14 system review of systems performed and notable only for those listed below and in HPI  above, all others are negative:  Constitutional: N/A  Cardiovascular: N/A  Ear/Nose/Throat:   Skin: N/A  Eyes: N/A  Respiratory:   Gastroitestinal: N/A  Genitourinary: N/A Hematology/Lymphatic: N/A  Endocrine: N/A  Musculoskeletal: N/A  Allergy/Immunology: N/A  Neurological: N/A  Psychiatric: N/A  The following represents the patient's updated allergies and side effects list: Allergies  Allergen Reactions  . Codeine Nausea And Vomiting    Labs since last visit of relevance include the following: Results for orders placed or performed in visit on 07/28/14  Implantable device - remote  Result Value Ref Range   Pulse Generator Manufacturer MERM    Date Time Interrogation Session [id number]    Pulse Gen Model HAL93 Reveal LINQ    Pulse Gen Serial Number [id number]    Implantable Pulse Generator Type ICM/ILR    Implantable Pulse Generator Implant Date [id number]    Eval Rhythm SR     The neurologically relevant items on the patient's problem list were reviewed on today's visit.  Neurologic Examination  A problem focused neurological exam (12 or more points of the single system neurologic examination, vital signs counts as 1 point, cranial nerves count for 8 points) was performed.  Blood pressure 136/79, pulse 57, height 5\' 10"  (1.778 m), weight 277 lb 9.6 oz (125.919 kg).  General - Well nourished, well developed, in no apparent distress.  Ophthalmologic - Sharp disc margins OU.  Cardiovascular - Regular rate and rhythm with no murmur.  Mental Status -  Level of arousal and orientation to time, place, and person were intact. Language including expression, naming, repetition, comprehension, reading, and writing was assessed and found intact. Attention span and concentration were normal. Recent and remote memory were intact. Fund of Knowledge was assessed and was intact.  Cranial Nerves II - XII - II - Visual field intact OU. III, IV, VI - Extraocular  movements intact. V - Facial sensation intact bilaterally. VII - Facial movement intact bilaterally. VIII - Hearing & vestibular intact bilaterally. X - Palate elevates symmetrically. XI - Chin turning & shoulder shrug intact bilaterally. XII - Tongue protrusion intact.  Motor Strength - The patient's strength was normal in all extremities and pronator drift was absent.  Bulk was normal and fasciculations were absent.   Motor Tone - Muscle tone was assessed at the neck and appendages and was normal.  Reflexes - The patient's reflexes were normal in all extremities and he had no pathological reflexes.  Sensory - Light touch, temperature/pinprick, vibration and proprioception, and Romberg testing were assessed and were normal.    Coordination - The patient had normal movements in the hands and feet with no ataxia or dysmetria.  Tremor was absent.  Gait and Station - The patient's transfers, posture, gait, station, and turns were observed as normal.  Data reviewed: I personally reviewed the images and agree with the radiology interpretations.  CT of the brain 09/28/2013 Normal head CT with no acute intracranial process identified.  CT Angio Head & Neck  09/29/2013 1. Irregular calcified and noncalcified plaque within the proximal left internal carotid artery results an an irregular stenosis of slightly greater than 50%. 2. Smooth narrowing of the proximal right internal carotid artery without significant stenosis. 3. Mild atherosclerotic irregularity within the petrous left internal carotid artery without a significant stenosis. 4. Mild distal small vessel disease bilaterally. 5. No significant proximal stenosis, aneurysm, or branch vessel occlusion within the circle of Willis. 6. The high posterior left frontal lobe infarct is visualized with the aid of the prior MRI. There is no significant interval change.  MRI of the brain  09/28/2013 Small, acute posterior left frontal lobe infarcts.  01/20/14  - 1. No acute intracranial abnormality. 2. Stable minimal white matter disease. MRA of the brain  09/28/2013 New major intracranial arterial occlusion. 3. 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  01/20/14 - 1. Minimal irregularity of the petrous left internal carotid artery is stable. This may be artifactual. There is no significant Stenosis. 2. Mild distal small vessel disease is stable. MRA of the neck 01/20/14 - 1. Minimal atherosclerotic changes in the proximal right internal carotid artery without significant stenosis relative to the more distal vessel. 2. Slight irregularity of the petrous left internal carotid artery is confirmed, likely related to atherosclerotic disease. 3. No significant stenosis within the neck. Carotid Doppler Findings suggest upper range 1-39% right internal carotid artery stenosis and 40-59% left internal carotid artery stenosis. Vertebral arteries are patent with antegrade flow.  2D Echocardiogram  09/29/13 - EF 55-60% with no source of embolus. Normal biventricular size and function. Impaired relaxation. Normal filling pressures. No significant valvular abnormalities. PASP can&'t be assessed on current study.  01/20/14 - Limited images. Moderate LVH with LVEF 71-24%, grade 1 diastolic dysfunction. No clear aortic valvular masses or vegetations noted based on limited views. 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 No aortic regurgitation. Mildly ectatic aortic root. Trivialtricuspid regurgitation with PASP 31 mmHg. No obvious PFO or ASD. CXR 09/28/2013 There is no evidence of CHF nor pneumonia nor other acute cardiopulmonary abnormality.  EKG normal sinus rhythm.  TEE 10/01/13 - Left ventricle: Systolic function was normal. The estimated ejection fraction was in the range of 55% to 60%. Wall motion was normal; there were no regional  wall motion abnormalities. - Aortic valve: There is a very small, filamentous mobile echodensity on the aortic valve, probably attached to the non coronary cusp. It prolapses into the LV outflow tract in diastole. 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 If no other etiology is identified for the patient&'s ischemic stroke, consider repeat TEE in a few months. - Aorta: There was minimal atheroma in the descending aorta, but none was seen in the ascending aorta or arch. - Left atrium: No evidence of thrombus in the atrial cavity or appendage. - Right atrium: No evidence of thrombus in the atrial cavity or appendage. - Atrial septum: Echo contrast study showed no right-to-left atrial level shunt, at baseline or with provocation.  TEE 09/01/14 -  Findings: Normal LV size and function -, Normal RV size and function -, MV Structurally normal, AV Structurally normal, PV Structurally normal and TV Struturally normal Left Atrium: No masses or thrombi Right Atrium: No masses or thrombi Left Atrial Appendage: No thrombi Intra-Artial Septum: Intact, no evidence of shunt Aorta: No significant Aortic  plaque  MRI 08/30/14 There are 3 small foci of diffusion. No hemorrhage restriction in the left posterior frontal corona radiata/caudate body adjacent left lateral ventricle. There is associated decreased signal intensity and ADC map and T2 signal hyperintensity consistent with acute infarct. No hemorrhage. The brain parenchyma is otherwise unremarkable.  The ventricular system is normal in size and morphology for the patient's age. Vascular flow voids are unremarkable The paranasal sinuses and mastoid air cells are unremarkable. The orbital structures are unremarkable.  CTA neck 08/29/14 1. Atherosclerotic plaque plaque resulting in 50% stenosis of the origin of the right internal carotid artery using the NASCET  criteria. 2. Plaque at the origin of the left internal carotid artery results in less than 50% stenosis using the NASCET criteria 3. The origin of the bilateral vertebral arteries is not well-visualized due to adjacent artifact but high-grade stenosis is considered unlikely 4. Otherwise unremarkable CT angiogram of the head and neck. - No evidence of significant intracranial aneurysm or stenosis. No evidence of venous thrombosis - No evidence of arterial dissection.  LDL 67->108 and A1C 5.9->5.7  Assessment: As you may recall, he is a 54 y.o. Caucasian male with PMH of HTN, HLD, CAD s/p CABG in 1997, questionable TIA 6-7 years ago was admitted on 09/28/13 due to left frontal several small acute infarct consistent with embolic pattern. CTA showed bilateral ICA proximal soft plaques with left ICA 50% stenosis. His stroke most likely due to the soft plaque and stenosis. 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 He was on plavix with statin. in 01/20/14 pt had another simpler episodes. MRI negative and his loop recorder so far negative. Although there is controversy regarding the treatment for Lambl&'s excresence, due to frequent symptoms and free mobile element on the TEE, he was put on coumadin after discussion with his cardiologist. Plan to repeat TEE in 07/2014 but was not done. 08/2014 had again right side weakness episode and MRI showed left frontal, CR, caudate head punctate infarct. CTA showed left ICA < 50% stenosis. Repeat TEE normal.   For his symptoms since 09/2013, 2 strokes with 1 TIA all had similar symptoms on the right. His CTA neck last year showed significant soft plaque at left ICA origin. Although only 50% stenosis, soft plaque may explain the exclusive right sided symptoms. cardioembolic less likely now and TEE repeat was norma. Will d/c coumadin and put on dural antiplatelet. Close monitor left ICA  stenosis. LDL trending up and now 108.  Plan:  - stop coumadin and change ASA 325 to 81mg  - start baby ASA and plavix for stroke prevention.  - continue crestor 40mg  for stroke prevention - check carotid doppler and lipid profile next visit - Follow up with your primary care physician for stroke risk factor modification. Recommend maintain blood pressure goal <130/80, diabetes with hemoglobin A1c goal below 6.5% and lipids with LDL cholesterol goal below 70 mg/dL.  - check BP at home - RTC in 3 months.   No orders of the defined types were placed in this encounter.    Meds ordered this encounter  Medications  . DISCONTD: aspirin EC 325 MG tablet    Sig: Take by mouth.  . Cholecalciferol (VITAMIN D-3 PO)    Sig: Take by mouth.  . Glucosamine-Chondroitin 500-400 MG CAPS    Sig: Take by mouth.  . metoprolol tartrate (LOPRESSOR) 25 MG tablet    Sig: Take by mouth.  Marland Kitchen  DISCONTD: omeprazole (PRILOSEC) 10 MG capsule    Sig: Take by mouth.  . Coenzyme Q10 (CO Q-10) 100 MG CAPS    Sig: Take by mouth.  . loratadine (CLARITIN) 10 MG tablet    Sig: Take by mouth.  . Ascorbic Acid (VITAMIN C) 1000 MG tablet    Sig: Take by mouth.  Marland Kitchen lisinopril (PRINIVIL,ZESTRIL) 10 MG tablet    Sig: Take by mouth.  . rosuvastatin (CRESTOR) 40 MG tablet    Sig: Take by mouth.  . oxyCODONE-acetaminophen (PERCOCET/ROXICET) 5-325 MG per tablet    Sig:   . tamsulosin (FLOMAX) 0.4 MG CAPS capsule    Sig:   . DISCONTD: warfarin (COUMADIN) 1 MG tablet    Sig: Take by mouth.  . DISCONTD: warfarin (COUMADIN) 6 MG tablet    Sig:   . CRESTOR 40 MG tablet    Sig:   . tamsulosin (FLOMAX) 0.4 MG CAPS capsule    Sig:   . clopidogrel (PLAVIX) 75 MG tablet    Sig: Take 1 tablet (75 mg total) by mouth daily.    Dispense:  90 tablet    Refill:  3  . aspirin EC 81 MG tablet    Sig: Take 1 tablet (81 mg total) by mouth daily.    Patient Instructions  - stop coumadin and change ASA 325 to 81mg  - start baby ASA  and plavix for stroke prevention.  - continue crestor 40mg  for stroke prevention - Consider carotid doppler and check lipid profile next visit - Follow up with your primary care physician for stroke risk factor modification. Recommend maintain blood pressure goal <130/80, diabetes with hemoglobin A1c goal below 6.5% and lipids with LDL cholesterol goal below 70 mg/dL.  - check BP at home - health diet and regular exercise - follow up in 3 months.    Rosalin Hawking, MD PhD [facility] [address] [place], [place] [address] [phone]

## 2014-09-12 NOTE — Patient Instructions (Addendum)
-   stop coumadin and change ASA 325 to 81mg  - start baby ASA and plavix for stroke prevention.  - continue crestor 40mg  for stroke prevention - Consider carotid doppler and check lipid profile next visit - Follow up with your primary care physician for stroke risk factor modification. Recommend maintain blood pressure goal <130/80, diabetes with hemoglobin A1c goal below 6.5% and lipids with LDL cholesterol goal below 70 mg/dL.  - check BP at home - health diet and regular exercise - follow up in 3 months.

## 2014-09-26 ENCOUNTER — Ambulatory Visit (INDEPENDENT_AMBULATORY_CARE_PROVIDER_SITE_OTHER): Payer: 59 | Admitting: *Deleted

## 2014-09-26 DIAGNOSIS — I639 Cerebral infarction, unspecified: Secondary | ICD-10-CM | POA: Diagnosis not present

## 2014-09-26 LAB — CUP PACEART REMOTE DEVICE CHECK: Date Time Interrogation Session: 20160808154831

## 2014-09-27 LAB — CUP PACEART REMOTE DEVICE CHECK: Date Time Interrogation Session: 20160809151129

## 2014-09-27 NOTE — Progress Notes (Signed)
Loop recorder 

## 2014-10-05 ENCOUNTER — Encounter: Payer: Self-pay | Admitting: Internal Medicine

## 2014-10-06 ENCOUNTER — Encounter: Payer: Self-pay | Admitting: Internal Medicine

## 2014-10-26 ENCOUNTER — Ambulatory Visit (INDEPENDENT_AMBULATORY_CARE_PROVIDER_SITE_OTHER): Payer: 59 | Admitting: *Deleted

## 2014-10-26 DIAGNOSIS — I639 Cerebral infarction, unspecified: Secondary | ICD-10-CM | POA: Diagnosis not present

## 2014-10-27 NOTE — Progress Notes (Signed)
Loop recorder 

## 2014-11-03 ENCOUNTER — Other Ambulatory Visit: Payer: Self-pay | Admitting: Neurology

## 2014-11-21 ENCOUNTER — Encounter: Payer: Self-pay | Admitting: Neurology

## 2014-11-21 ENCOUNTER — Ambulatory Visit (INDEPENDENT_AMBULATORY_CARE_PROVIDER_SITE_OTHER): Payer: 59 | Admitting: Neurology

## 2014-11-21 VITALS — BP 132/81 | HR 59 | Ht 70.0 in | Wt 279.0 lb

## 2014-11-21 DIAGNOSIS — I6523 Occlusion and stenosis of bilateral carotid arteries: Secondary | ICD-10-CM

## 2014-11-21 DIAGNOSIS — I639 Cerebral infarction, unspecified: Secondary | ICD-10-CM | POA: Diagnosis not present

## 2014-11-21 DIAGNOSIS — E669 Obesity, unspecified: Secondary | ICD-10-CM | POA: Diagnosis not present

## 2014-11-21 DIAGNOSIS — E785 Hyperlipidemia, unspecified: Secondary | ICD-10-CM | POA: Diagnosis not present

## 2014-11-21 DIAGNOSIS — I10 Essential (primary) hypertension: Secondary | ICD-10-CM

## 2014-11-21 NOTE — Patient Instructions (Signed)
-   continue ASA and plavix for stroke prevention.  - continue crestor 40mg  for stroke prevention - continue zetia for high TG. - check CT angiogram of neck to follow up with carotid artery stenosis.  - Follow up with your primary care physician for stroke risk factor modification. Recommend maintain blood pressure goal <130/80, diabetes with hemoglobin A1c goal below 6.5% and lipids with LDL cholesterol goal below 70 mg/dL.  - check BP at home. - follow up in 6 months.

## 2014-11-21 NOTE — Progress Notes (Signed)
STROKE NEUROLOGY FOLLOW UP NOTE  NAME: Justin Sexton DOB: 1960/07/30  REASON FOR VISIT: stroke follow up HISTORY FROM: pt and chart  Today we had the pleasure of seeing Justin Sexton in follow-up at our Neurology Clinic. Pt was accompanied by no one.   History Summary 54 yo M with PMH of HTN, HLD, CAD s/p CABG in 1997, questionable TIA 6-7 years ago was admitted on 09/28/13 due to acute onset right arm and leg weakness which started to improve after 20-21min. When he was in ER, symptoms largely resolved. He did not get tPA. 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 His CTA neck showed bilateral soft plaques at proximal ICAs, especially on the left with 50% stenosis. TEE done showed a very small, filamentous mobile echodensity on the aortic valve, probably attached to the non coronary cusp. 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 He had loop recorder placed last month. So far no Afib episodes.   Follow up 11/22/13 -  the patient has been doing well. No recurrent symptoms. Continued on plavix and crestor for stroke prevention. BP today in clinic 141/85.  Follow up 02/01/14 - During the interval time, pt had another episode on 01/20/14 similar to the one in 09/2013 with right arm and leg weakness lasting about 20-30 min. At that time, he was in bed about to go to sleep and then he felt weakness. He stood up from bed and made sure that he did felt weakness. This episode was not as severe as the one in 09/2013. [facility] and was admitted for evaluation with CT and MRI which did not show acute stroke. 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 No further work up or medication change made. However, pt added ASA 81mg  by him own. Her loop recorder continued to show no afib  episode.  I have contacted his PCP Dr. Woody Seller during the interval time regarding cardiology referral. He went to see his cardiologist in [place] and he was told that the cardiologist may consider to change his blood thinners, but he can not remember details.   Follow up 04/11/14 - pt was doing well without stroke like symptoms. I contacted his cardiologist in [facility] Dr. Aram Candela, who agrees with coumadin and agrees to repeat TEE in 6 months. He was put on coumadin and his INR is therapeutic. As per pt, last check was between 2-3. However, he is still on plavix with coumadin. His BP today in clinic 131/78. Loop recorder did not show afib episode.  Follow up 07/20/14 - pt has been doing well without recurrent stroke like symptoms. His INR last check subtherapeutic as he ate a lot of vegetables. He will have INR check this Friday. He seems also has appointment with cardiology this month for repeat TEE but he needs to call for confirmation. BP today 141/81. Still on coumadin and ASA 81mg , no side effect.   Follow-up 09/12/14 - pt was admitted in [facility] in [place] on 08/29/14 for acute onset right leg and right arm weakness. The episode lasted about 20 minutes, then resolved. He felt some residual coordination issues afterwards. INR 1.2 on admission. MRI showed 3 punctate infarcts at left posterior frontal, corona radiata/caudate body adjacent left lateral ventricle. Repeat TEE normal with disappearance of previous vegetation. CTA neck showed  right ICA 50% stenosis and left ICA less than 50% stenosis but with plaque. He was discharged with coumadin and ASA bridging with lovenox. On discharge his INR 2.1.  After discharge, he was back to [place] and followed up with PCP. Continued on coumadin and ASA. Felt left arm and leg some subtle weakness but not on exam. His BP 136/79.  Interval History During the interval time, he has been doing well, no recurrent stroke like symptoms. Continued on aspirin and Plavix.  Blood pressure in good control 132/81. Had recent lab with PCP, however, results are not available at this time. Still working on weight loss, denies symptoms of sleep apnea. Otherwise no complaints.   REVIEW OF SYSTEMS: Full 14 system review of systems performed and notable only for those listed below and in HPI above, all others are negative:  Constitutional: N/A  Cardiovascular: N/A  Ear/Nose/Throat:   Skin: N/A  Eyes: N/A  Respiratory:   Gastroitestinal: N/A  Genitourinary: N/A Hematology/Lymphatic: N/A  Endocrine: N/A  Musculoskeletal: N/A  Allergy/Immunology: N/A  Neurological: N/A  Psychiatric: N/A  The following represents the patient's updated allergies and side effects list: Allergies  Allergen Reactions  . Codeine Nausea And Vomiting    Labs since last visit of relevance include the following: Results for orders placed or performed in visit on 09/26/14  Implantable device - remote  Result Value Ref Range   Pulse Generator Manufacturer MERM    Date Time Interrogation Session [id number]    Pulse Gen Model [id number] Reveal LINQ    Pulse Gen Serial Number [id number]    Implantable Pulse Generator Type ICM/ILR    Implantable Pulse Generator Implant Date [id number]    Eval Rhythm SR     The neurologically relevant items on the patient's problem list were reviewed on today's visit.  Neurologic Examination  A problem focused neurological exam (12 or more points of the single system neurologic examination, vital signs counts as 1 point, cranial nerves count for 8 points) was performed.  Blood pressure 132/81, pulse 59, height 5\' 10"  (1.778 m), weight 279 lb (126.554 kg).  General - Well nourished, well developed, in no apparent distress.  Ophthalmologic - Sharp disc margins OU.  Cardiovascular - Regular rate and rhythm with no murmur.  Mental Status -  Level of arousal and orientation to time, place, and person were intact. Language including  expression, naming, repetition, comprehension, reading, and writing was assessed and found intact. Attention span and concentration were normal. Recent and remote memory were intact. Fund of Knowledge was assessed and was intact.  Cranial Nerves II - XII - II - Visual field intact OU. III, IV, VI - Extraocular movements intact. V - Facial sensation intact bilaterally. VII - Facial movement intact bilaterally. VIII - Hearing & vestibular intact bilaterally. X - Palate elevates symmetrically. XI - Chin turning & shoulder shrug intact bilaterally. XII - Tongue protrusion intact.  Motor Strength - The patient's strength was normal in all extremities and pronator drift was absent.  Bulk was normal and fasciculations were absent.   Motor Tone - Muscle tone was assessed at the neck and appendages and was normal.  Reflexes - The patient's reflexes were normal in all extremities and he had no pathological reflexes.  Sensory - Light touch, temperature/pinprick, vibration and proprioception, and Romberg testing were assessed and were normal.    Coordination - The patient had normal movements in the hands and feet with no ataxia or dysmetria.  Tremor was absent.  Gait  and Station - The patient's transfers, posture, gait, station, and turns were observed as normal.  Data reviewed: I personally reviewed the images and agree with the radiology interpretations.  CT of the brain 09/28/2013 Normal head CT with no acute intracranial process identified.  CT Angio Head & Neck 09/29/2013 1. Irregular calcified and noncalcified plaque within the proximal left internal carotid artery results an an irregular stenosis of slightly greater than 50%. 2. Smooth narrowing of the proximal right internal carotid artery without significant stenosis. 3. Mild atherosclerotic irregularity within the petrous left internal carotid artery without a significant stenosis. 4. Mild distal small vessel disease bilaterally. 5. No  significant proximal stenosis, aneurysm, or branch vessel occlusion within the circle of Willis. 6. The high posterior left frontal lobe infarct is visualized with the aid of the prior MRI. There is no significant interval change.  MRI of the brain  09/28/2013 Small, acute posterior left frontal lobe infarcts.  01/20/14 - 1. No acute intracranial abnormality. 2. Stable minimal white matter disease. MRA of the brain  09/28/2013 New major intracranial arterial occlusion. 3. 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  01/20/14 - 1. Minimal irregularity of the petrous left internal carotid artery is stable. This may be artifactual. There is no significant Stenosis. 2. Mild distal small vessel disease is stable. MRA of the neck 01/20/14 - 1. Minimal atherosclerotic changes in the proximal right internal carotid artery without significant stenosis relative to the more distal vessel. 2. Slight irregularity of the petrous left internal carotid artery is confirmed, likely related to atherosclerotic disease. 3. No significant stenosis within the neck. Carotid Doppler Findings suggest upper range 1-39% right internal carotid artery stenosis and 40-59% left internal carotid artery stenosis. Vertebral arteries are patent with antegrade flow.  2D Echocardiogram  09/29/13 - EF 55-60% with no source of embolus. Normal biventricular size and function. Impaired relaxation. Normal filling pressures. No significant valvular abnormalities. PASP can&'t be assessed on current study.  01/20/14 - Limited images. Moderate LVH with LVEF 53-97%, grade 1 diastolic dysfunction. No clear aortic valvular masses or vegetations noted based on limited views. 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 No aortic regurgitation. Mildly ectatic aortic root. Trivialtricuspid regurgitation with PASP 31 mmHg. No  obvious PFO or ASD. CXR 09/28/2013 There is no evidence of CHF nor pneumonia nor other acute cardiopulmonary abnormality.  EKG normal sinus rhythm.  TEE 10/01/13 - Left ventricle: Systolic function was normal. The estimated ejection fraction was in the range of 55% to 60%. Wall motion was normal; there were no regional wall motion abnormalities. - Aortic valve: There is a very small, filamentous mobile echodensity on the aortic valve, probably attached to the non coronary cusp. It prolapses into the LV outflow tract in diastole. 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 If no other etiology is identified for the patient&'s ischemic stroke, consider repeat TEE in a few months. - Aorta: There was minimal atheroma in the descending aorta, but none was seen in the ascending aorta or arch. - Left atrium: No evidence of thrombus in the atrial cavity or appendage. - Right atrium: No evidence of thrombus in the atrial cavity or appendage. - Atrial septum: Echo contrast study showed no right-to-left atrial level shunt, at baseline or with provocation.  TEE 09/01/14 -  Findings: Normal LV size  and function -, Normal RV size and function -, MV Structurally normal, AV Structurally normal, PV Structurally normal and TV Struturally normal Left Atrium: No masses or thrombi Right Atrium: No masses or thrombi Left Atrial Appendage: No thrombi Intra-Artial Septum: Intact, no evidence of shunt Aorta: No significant Aortic plaque  MRI 08/30/14 There are 3 small foci of diffusion. No hemorrhage restriction in the left posterior frontal corona radiata/caudate body adjacent left lateral ventricle. There is associated decreased signal intensity and ADC map and T2 signal hyperintensity consistent with acute infarct. No hemorrhage. The brain parenchyma is otherwise unremarkable.  The ventricular system is normal in size and  morphology for the patient's age. Vascular flow voids are unremarkable The paranasal sinuses and mastoid air cells are unremarkable. The orbital structures are unremarkable.  CTA neck 08/29/14 1. Atherosclerotic plaque plaque resulting in 50% stenosis of the origin of the right internal carotid artery using the NASCET criteria. 2. Plaque at the origin of the left internal carotid artery results in less than 50% stenosis using the NASCET criteria 3. The origin of the bilateral vertebral arteries is not well-visualized due to adjacent artifact but high-grade stenosis is considered unlikely 4. Otherwise unremarkable CT angiogram of the head and neck. - No evidence of significant intracranial aneurysm or stenosis. No evidence of venous thrombosis - No evidence of arterial dissection.  LDL 67->108 and A1C 5.9->5.7  Assessment: As you may recall, he is a 54 y.o. Caucasian male with PMH of HTN, HLD, CAD s/p CABG in 1997, questionable TIA 6-7 years ago was admitted on 09/28/13 due to left frontal several small acute infarct consistent with embolic pattern. CTA showed bilateral ICA proximal soft plaques with left ICA 50% stenosis. His stroke most likely due to the soft plaque and stenosis. 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 He was on plavix with statin. in 01/20/14 pt had another simpler episodes. MRI negative and his loop recorder so far negative. Although there is controversy regarding the treatment for Lambl&'s excresence, due to frequent symptoms and free mobile element on the TEE, he was put on coumadin after discussion with his cardiologist. Plan to repeat TEE in 07/2014 but was not done. 08/2014 had again right side weakness episode and MRI showed left frontal, CR, caudate head punctate infarct. CTA showed left ICA < 50% stenosis. Repeat TEE normal.   For his symptoms since 09/2013, 2 strokes with 1 TIA all had  similar symptoms on the right. His CTA neck last year showed significant soft plaque at left ICA origin. Although only 50% stenosis, soft plaque may explain the exclusive right sided symptoms. cardioembolic less likely now and TEE repeat was norma. Coumadin was discontinued and put on dural antiplatelet. Will repeat CTA head and neck.  Plan:  - Continue baby ASA and plavix for stroke prevention.  - continue crestor 40mg  for stroke prevention - Continue Zetia for high TG - Repeat CTA head and neck - Follow up with your primary care physician for stroke risk factor modification. Recommend maintain blood pressure goal <130/80, diabetes with hemoglobin A1c goal below 6.5% and lipids with LDL cholesterol goal below 70 mg/dL.  - check BP at home - Regular exercise and lose weight - RTC in 6 months.   Orders Placed This Encounter  Procedures  . CT Angio Neck W/Cm &/Or Wo/Cm    Standing Status: Future     Number of Occurrences:  Standing Expiration Date: 01/21/2016    Order Specific Question:  Reason for Exam (SYMPTOM  OR DIAGNOSIS REQUIRED)    Answer:  ICA stenosis    Order Specific Question:  Preferred imaging location?    Answer:  Internal  . CT Angio Head W/Cm &/Or Wo Cm    Standing Status: Future     Number of Occurrences:      Standing Expiration Date: 01/21/2016    Order Specific Question:  Reason for Exam (SYMPTOM  OR DIAGNOSIS REQUIRED)    Answer:  ICA stenosis    Order Specific Question:  Preferred imaging location?    Answer:  Internal    Meds ordered this encounter  Medications  . NORCO 5-325 MG tablet    Sig: Takes if needed    Patient Instructions  - continue ASA and plavix for stroke prevention.  - continue crestor 40mg  for stroke prevention - continue zetia for high TG. - check CT angiogram of neck to follow up with carotid artery stenosis.  - Follow up with your primary care physician for stroke risk factor modification. Recommend maintain blood pressure goal  <130/80, diabetes with hemoglobin A1c goal below 6.5% and lipids with LDL cholesterol goal below 70 mg/dL.  - check BP at home. - follow up in 6 months.    Rosalin Hawking, MD PhD [facility] [address], [place], [place] [address] [phone]

## 2014-11-25 ENCOUNTER — Ambulatory Visit (INDEPENDENT_AMBULATORY_CARE_PROVIDER_SITE_OTHER): Payer: 59 | Admitting: *Deleted

## 2014-11-25 DIAGNOSIS — I639 Cerebral infarction, unspecified: Secondary | ICD-10-CM | POA: Diagnosis not present

## 2014-11-25 LAB — CUP PACEART REMOTE DEVICE CHECK: Date Time Interrogation Session: 20160907220822

## 2014-11-25 NOTE — Progress Notes (Signed)
Carelink summary report received. Battery status OK. Normal device function. No new symptom episodes, tachy episodes, brady, or pause episodes. No new AF episodes. Monthly summary reports and ROV w/ JA PRN. 

## 2014-11-29 NOTE — Progress Notes (Signed)
LOOP RECORDER  

## 2014-12-06 LAB — CUP PACEART REMOTE DEVICE CHECK: MDC IDC SESS DTM: 20161007223553

## 2014-12-06 NOTE — Progress Notes (Signed)
Carelink summary report received. Battery status OK. Normal device function. No new symptom episodes, tachy episodes, brady, or pause episodes. No new AF episodes. Monthly summary reports and ROV with JA PRN. 

## 2014-12-09 ENCOUNTER — Encounter: Payer: Self-pay | Admitting: Internal Medicine

## 2014-12-20 ENCOUNTER — Ambulatory Visit
Admission: RE | Admit: 2014-12-20 | Discharge: 2014-12-20 | Disposition: A | Discharge status: Home / Self Care | Payer: 59 | Source: Ambulatory Visit | Attending: Neurology | Admitting: Neurology

## 2014-12-20 DIAGNOSIS — I639 Cerebral infarction, unspecified: Secondary | ICD-10-CM

## 2014-12-20 MED ORDER — IOPAMIDOL (ISOVUE-370) INJECTION 76%
100.0000 mL | Freq: Once | INTRAVENOUS | Status: AC | PRN
  Administered 2014-12-20: 100 mL via INTRAVENOUS

## 2014-12-26 ENCOUNTER — Ambulatory Visit (INDEPENDENT_AMBULATORY_CARE_PROVIDER_SITE_OTHER): Payer: 59 | Admitting: *Deleted

## 2014-12-26 DIAGNOSIS — I639 Cerebral infarction, unspecified: Secondary | ICD-10-CM

## 2014-12-26 NOTE — Progress Notes (Signed)
Carelink Summary Report / Loop recorder 

## 2014-12-30 ENCOUNTER — Encounter: Payer: Self-pay | Admitting: Internal Medicine

## 2015-01-22 LAB — CUP PACEART REMOTE DEVICE CHECK: Date Time Interrogation Session: 20161106223612

## 2015-01-22 NOTE — Progress Notes (Signed)
Carelink summary report received. Battery status OK. Normal device function. No new symptom episodes, tachy episodes, brady, or pause episodes. No new AF episodes. Monthly summary reports and ROV with JA PRN. 

## 2015-01-24 ENCOUNTER — Ambulatory Visit (INDEPENDENT_AMBULATORY_CARE_PROVIDER_SITE_OTHER): Payer: 59 | Admitting: *Deleted

## 2015-01-24 DIAGNOSIS — I639 Cerebral infarction, unspecified: Secondary | ICD-10-CM

## 2015-01-25 NOTE — Progress Notes (Signed)
Carelink Summary Report / Loop Recorder 

## 2015-02-23 ENCOUNTER — Ambulatory Visit (INDEPENDENT_AMBULATORY_CARE_PROVIDER_SITE_OTHER): Payer: Self-pay | Admitting: *Deleted

## 2015-02-23 DIAGNOSIS — I639 Cerebral infarction, unspecified: Secondary | ICD-10-CM

## 2015-02-24 NOTE — Progress Notes (Signed)
Carelink Summary Report / Loop Recorder 

## 2015-03-21 LAB — CUP PACEART REMOTE DEVICE CHECK: Date Time Interrogation Session: 20161206230732

## 2015-03-27 ENCOUNTER — Ambulatory Visit (INDEPENDENT_AMBULATORY_CARE_PROVIDER_SITE_OTHER): Payer: Self-pay | Admitting: *Deleted

## 2015-03-27 DIAGNOSIS — I639 Cerebral infarction, unspecified: Secondary | ICD-10-CM

## 2015-03-28 NOTE — Progress Notes (Signed)
Carelink Summary Report / Loop Recorder 

## 2015-04-06 LAB — CUP PACEART REMOTE DEVICE CHECK: Date Time Interrogation Session: 20170105230725

## 2015-04-06 NOTE — Progress Notes (Signed)
Carelink summary report received. Battery status OK. Normal device function. No new symptom episodes, tachy episodes, brady, or pause episodes. No new AF episodes. Monthly summary reports and ROV/PRN 

## 2015-04-24 ENCOUNTER — Ambulatory Visit (INDEPENDENT_AMBULATORY_CARE_PROVIDER_SITE_OTHER): Payer: Self-pay | Admitting: *Deleted

## 2015-04-24 DIAGNOSIS — I639 Cerebral infarction, unspecified: Secondary | ICD-10-CM

## 2015-04-25 LAB — CUP PACEART REMOTE DEVICE CHECK: MDC IDC SESS DTM: 20170204230837

## 2015-04-25 NOTE — Progress Notes (Signed)
Carelink summary report received. Battery status OK. Normal device function. No new symptom episodes, tachy episodes, brady, or pause episodes. No new AF episodes. Monthly summary reports and ROV/PRN 

## 2015-04-27 NOTE — Progress Notes (Signed)
Carelink Summary Report / Loop Recorder 

## 2015-04-29 LAB — CUP PACEART REMOTE DEVICE CHECK: Date Time Interrogation Session: 20170306233652

## 2015-04-29 NOTE — Progress Notes (Signed)
Carelink summary report received. Battery status OK. Normal device function. No new symptom episodes, tachy episodes, brady, or pause episodes. No new AF episodes. Monthly summary reports and ROV/PRN 

## 2015-05-24 ENCOUNTER — Ambulatory Visit (INDEPENDENT_AMBULATORY_CARE_PROVIDER_SITE_OTHER): Payer: Self-pay | Admitting: *Deleted

## 2015-05-24 ENCOUNTER — Ambulatory Visit: Payer: 59 | Admitting: Neurology

## 2015-05-24 DIAGNOSIS — I639 Cerebral infarction, unspecified: Secondary | ICD-10-CM

## 2015-05-25 NOTE — Progress Notes (Signed)
Carelink Summary Report / Loop Recorder 

## 2015-06-06 ENCOUNTER — Ambulatory Visit: Payer: Self-pay | Admitting: Neurology

## 2015-06-07 ENCOUNTER — Encounter: Payer: Self-pay | Admitting: Neurology

## 2015-06-23 ENCOUNTER — Ambulatory Visit (INDEPENDENT_AMBULATORY_CARE_PROVIDER_SITE_OTHER): Payer: BLUE CROSS/BLUE SHIELD | Admitting: *Deleted

## 2015-06-23 DIAGNOSIS — I639 Cerebral infarction, unspecified: Secondary | ICD-10-CM | POA: Diagnosis not present

## 2015-06-26 NOTE — Progress Notes (Signed)
Carelink Summary Report / Loop Recorder 

## 2015-07-15 LAB — CUP PACEART REMOTE DEVICE CHECK: MDC IDC SESS DTM: 20170406000603

## 2015-07-15 NOTE — Progress Notes (Signed)
Carelink summary report received. Battery status OK. Normal device function. No new symptom episodes, tachy episodes, brady, or pause episodes. No new AF episodes. Monthly summary reports and ROV/PRN 

## 2015-07-24 ENCOUNTER — Ambulatory Visit (INDEPENDENT_AMBULATORY_CARE_PROVIDER_SITE_OTHER): Payer: BLUE CROSS/BLUE SHIELD | Admitting: *Deleted

## 2015-07-24 DIAGNOSIS — I639 Cerebral infarction, unspecified: Secondary | ICD-10-CM

## 2015-07-24 NOTE — Progress Notes (Signed)
Carelink Summary Report / Loop Recorder 

## 2015-08-02 LAB — CUP PACEART REMOTE DEVICE CHECK: Date Time Interrogation Session: 20170506024129

## 2015-08-23 ENCOUNTER — Ambulatory Visit (INDEPENDENT_AMBULATORY_CARE_PROVIDER_SITE_OTHER): Payer: BLUE CROSS/BLUE SHIELD | Admitting: *Deleted

## 2015-08-23 DIAGNOSIS — I639 Cerebral infarction, unspecified: Secondary | ICD-10-CM

## 2015-08-23 NOTE — Progress Notes (Signed)
Carelink Summary Report / Loop Recorder 

## 2015-08-29 LAB — CUP PACEART REMOTE DEVICE CHECK: MDC IDC SESS DTM: 20170605030634

## 2015-09-08 LAB — CUP PACEART REMOTE DEVICE CHECK: Date Time Interrogation Session: 20170705030812

## 2015-09-13 ENCOUNTER — Other Ambulatory Visit: Payer: Self-pay

## 2015-09-13 DIAGNOSIS — I639 Cerebral infarction, unspecified: Secondary | ICD-10-CM

## 2015-09-13 MED ORDER — CLOPIDOGREL BISULFATE 75 MG PO TABS: 75.0000 mg | ORAL_TABLET | Freq: Every day | ORAL | 0 refills | Status: DC

## 2015-09-21 ENCOUNTER — Ambulatory Visit (INDEPENDENT_AMBULATORY_CARE_PROVIDER_SITE_OTHER): Payer: BLUE CROSS/BLUE SHIELD | Admitting: *Deleted

## 2015-09-21 DIAGNOSIS — I639 Cerebral infarction, unspecified: Secondary | ICD-10-CM

## 2015-09-22 NOTE — Progress Notes (Signed)
Carelink Summary Report / Loop Recorder 

## 2015-10-02 LAB — CUP PACEART REMOTE DEVICE CHECK: MDC IDC SESS DTM: 20170804030929

## 2015-10-12 ENCOUNTER — Other Ambulatory Visit: Payer: Self-pay | Admitting: Neurology

## 2015-10-12 DIAGNOSIS — I639 Cerebral infarction, unspecified: Secondary | ICD-10-CM

## 2015-10-24 ENCOUNTER — Ambulatory Visit (INDEPENDENT_AMBULATORY_CARE_PROVIDER_SITE_OTHER): Payer: BLUE CROSS/BLUE SHIELD | Admitting: *Deleted

## 2015-10-24 DIAGNOSIS — I639 Cerebral infarction, unspecified: Secondary | ICD-10-CM

## 2015-10-24 NOTE — Progress Notes (Signed)
Carelink Summary Report / Loop Recorder 

## 2015-11-13 ENCOUNTER — Other Ambulatory Visit: Payer: Self-pay | Admitting: Neurology

## 2015-11-13 DIAGNOSIS — I639 Cerebral infarction, unspecified: Secondary | ICD-10-CM

## 2015-11-13 LAB — CUP PACEART REMOTE DEVICE CHECK: Date Time Interrogation Session: 20170903033840

## 2015-11-14 ENCOUNTER — Telehealth: Payer: Self-pay

## 2015-11-14 NOTE — Telephone Encounter (Signed)
Rn call Tye Maryland at [facility] at [phone]. Rn stated pt was last seen 11/2014 by Dr. Ashby Dawes had a follow up appt in 05/2015 but was a no show. Rn stated pt needs to contact his refill for the  Lisinopril to be continue. Refill for lisinopril will be denied. Rn stated pt was given 30 day refill for plavix on 11/13/2015. Rn stated patient needs to contact our office and schedule an appt to continue refills for plavix. Tye Maryland stated she will notify patient of the need to schedule appt. Pt was last seen 11/2014.

## 2015-11-20 ENCOUNTER — Ambulatory Visit (INDEPENDENT_AMBULATORY_CARE_PROVIDER_SITE_OTHER): Payer: BLUE CROSS/BLUE SHIELD | Admitting: *Deleted

## 2015-11-20 DIAGNOSIS — I639 Cerebral infarction, unspecified: Secondary | ICD-10-CM | POA: Diagnosis not present

## 2015-11-22 NOTE — Progress Notes (Signed)
Carelink Summary Report / Loop Recorder 

## 2015-12-11 ENCOUNTER — Other Ambulatory Visit: Payer: Self-pay | Admitting: Neurology

## 2015-12-11 DIAGNOSIS — I639 Cerebral infarction, unspecified: Secondary | ICD-10-CM

## 2015-12-12 ENCOUNTER — Telehealth: Payer: Self-pay

## 2015-12-12 NOTE — Telephone Encounter (Signed)
Rn call patient about his plavix refill. Pt was last seen 11/2014 by Dr. Erlinda Hong. Rn ask patient was he doing okay since last visit. Pt states he feels great, and does his PCP. Rn advise pt to call his PCP to start refill his plavix medication. Rn advised pt to call back if there was a issue. Pt verbalized understanding.

## 2015-12-19 NOTE — Telephone Encounter (Signed)
See phone note, PTs PCP will be managing Plavix refills.

## 2015-12-19 NOTE — Telephone Encounter (Signed)
Rn spoke with patient about his plavix refills. Rn ask pt will his PCP be refilling his plavix. PT stated he has enough plavis to last him another month or two.Pt stated he will be scheduling an appt with his PCP within the next month for an app. Pt stated his PCP can do future refills on the plavix. Pt stated he feels fine neurological wise and does not need to be seen by Dr.Xu. Rn stated he can always call for an appt if he is having any neuro issues. Pt verbalized understanding. Pts refill will be deny because his PCP will be managing his plavix.

## 2015-12-20 ENCOUNTER — Ambulatory Visit (INDEPENDENT_AMBULATORY_CARE_PROVIDER_SITE_OTHER): Payer: BLUE CROSS/BLUE SHIELD | Admitting: *Deleted

## 2015-12-20 DIAGNOSIS — I639 Cerebral infarction, unspecified: Secondary | ICD-10-CM | POA: Diagnosis not present

## 2015-12-21 NOTE — Progress Notes (Signed)
Carelink Summary Report / Loop Recorder 

## 2015-12-30 LAB — CUP PACEART REMOTE DEVICE CHECK
Date Time Interrogation Session: 20171003053552
MDC IDC PG IMPLANT DT: 20150814

## 2015-12-30 NOTE — Progress Notes (Signed)
Carelink summary report received. Battery status OK. Normal device function. No new symptom episodes, tachy episodes, brady, or pause episodes. No new AF episodes. Monthly summary reports and ROV/PRN 

## 2016-01-19 ENCOUNTER — Ambulatory Visit (INDEPENDENT_AMBULATORY_CARE_PROVIDER_SITE_OTHER): Payer: BLUE CROSS/BLUE SHIELD | Admitting: *Deleted

## 2016-01-19 DIAGNOSIS — I639 Cerebral infarction, unspecified: Secondary | ICD-10-CM

## 2016-01-22 NOTE — Progress Notes (Signed)
Carelink Summary Report / Loop Recorder 

## 2016-01-27 LAB — CUP PACEART REMOTE DEVICE CHECK
Implantable Pulse Generator Implant Date: 20150814
MDC IDC SESS DTM: 20171102060618

## 2016-01-27 NOTE — Progress Notes (Signed)
Carelink summary report received. Battery status OK. Normal device function. No new symptom episodes, tachy episodes, brady, or pause episodes. No new AF episodes. Monthly summary reports and ROV/PRN 

## 2016-02-20 ENCOUNTER — Ambulatory Visit (INDEPENDENT_AMBULATORY_CARE_PROVIDER_SITE_OTHER): Payer: BLUE CROSS/BLUE SHIELD | Admitting: *Deleted

## 2016-02-20 DIAGNOSIS — I639 Cerebral infarction, unspecified: Secondary | ICD-10-CM | POA: Diagnosis not present

## 2016-02-21 NOTE — Progress Notes (Signed)
Carelink Summary Report 

## 2016-02-28 LAB — CUP PACEART REMOTE DEVICE CHECK
Date Time Interrogation Session: 20171202064125
MDC IDC PG IMPLANT DT: 20150814

## 2016-03-20 ENCOUNTER — Ambulatory Visit (INDEPENDENT_AMBULATORY_CARE_PROVIDER_SITE_OTHER): Payer: BLUE CROSS/BLUE SHIELD | Admitting: *Deleted

## 2016-03-20 DIAGNOSIS — I639 Cerebral infarction, unspecified: Secondary | ICD-10-CM

## 2016-03-20 NOTE — Progress Notes (Signed)
Carelink Summary Report / Loop Recorder 

## 2016-04-01 LAB — CUP PACEART REMOTE DEVICE CHECK
Date Time Interrogation Session: 20180101070937
MDC IDC PG IMPLANT DT: 20150814

## 2016-04-18 LAB — CUP PACEART REMOTE DEVICE CHECK
Implantable Pulse Generator Implant Date: 20150814
MDC IDC SESS DTM: 20180131074002

## 2016-04-19 ENCOUNTER — Ambulatory Visit (INDEPENDENT_AMBULATORY_CARE_PROVIDER_SITE_OTHER): Payer: Self-pay | Admitting: *Deleted

## 2016-04-19 DIAGNOSIS — I639 Cerebral infarction, unspecified: Secondary | ICD-10-CM

## 2016-04-22 NOTE — Progress Notes (Signed)
Carelink Summary Report / Loop Recorder 

## 2016-05-08 LAB — CUP PACEART REMOTE DEVICE CHECK
Date Time Interrogation Session: 20180302074033
MDC IDC PG IMPLANT DT: 20150814

## 2016-05-14 IMAGING — CT CT ANGIO HEAD
1 of 10 series · 5 of 33 positions shown · IV contrast ([ID] [id number])
Comparison: Brain MRI 01/20/2014.

CLINICAL DATA: 54-year-old male with carotid stenosis. Personal
history of episodes of right side weakness. Subsequent encounter.

EXAM:
CT ANGIOGRAPHY HEAD AND NECK
TECHNIQUE: Multidetector CT imaging of the head and neck was performed using
the standard protocol during bolus administration of intravenous
contrast. Multiplanar CT image reconstructions and MIPs were
obtained to evaluate the vascular anatomy. Carotid stenosis
measurements (when applicable) are obtained utilizing NASCET
criteria, using the distal internal carotid diameter as the
denominator.
CONTRAST:  100 mL Isovue 370

[Series 605: axial thin · axial · 0.75mm/px · z∈[+99,+348]mm · 5 of 375 slices shown]
[im 63/375  soft-tissue]
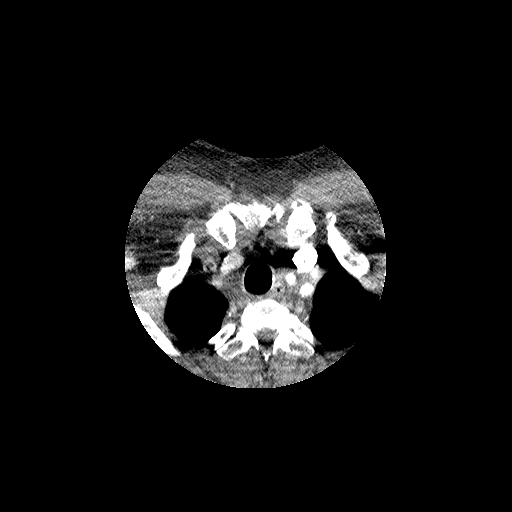
[im 125/375  bone]
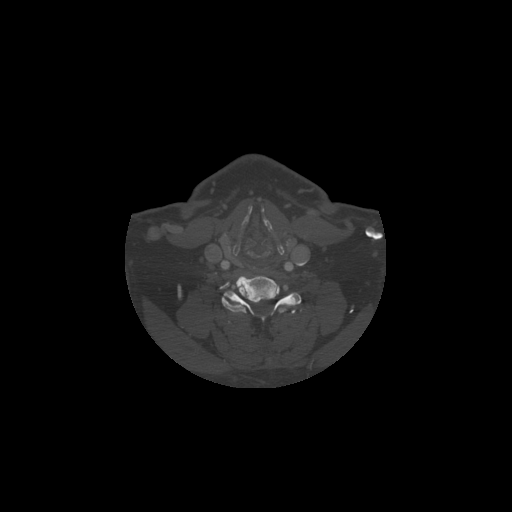
[im 188/375  soft-tissue]
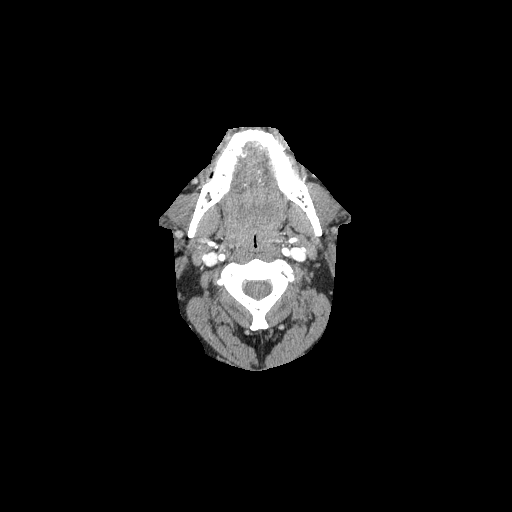
[im 250/375  bone]
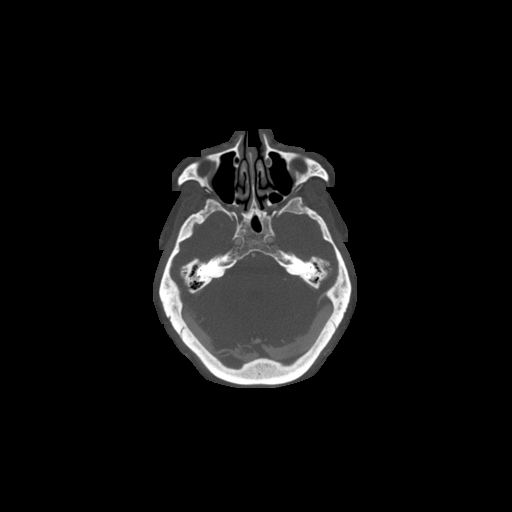
[im 312/375  soft-tissue]
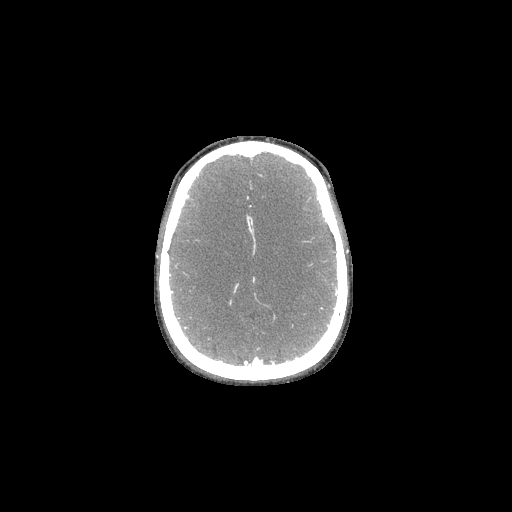

[5 of 33 positions shown; findings below may reference images not displayed]

Neck MRA 01/20/2014. Intracranial
MRA 01/20/2014. Head CT without contrast 01/20/2014.
FINDINGS: CT HEAD

Brain: Cerebral volume is stable and within normal limits. No
midline shift, ventriculomegaly, mass effect, evidence of mass
lesion, intracranial hemorrhage or evidence of cortically based
acute infarction. Gray-white matter differentiation is within normal
limits throughout the brain.

Calvarium and skull base:  No acute osseous abnormality identified.

Paranasal sinuses: Visualized paranasal sinuses and mastoids are
clear.

Orbits: Visualized orbits and scalp soft tissues are within normal
limits.

CTA NECK

Skeleton: Sequelae of median sternotomy. Benign appearing sclerosis
of the left C3 vertebral body and pedicle (series 7, image 70).
Elsewhere bone mineralization within normal limits. No acute osseous
abnormality identified.

Other neck: Negative lung apices. No superior mediastinal
lymphadenopathy. Partially visible sequelae of CABG.

Thyroid, larynx (glottis is closed), pharynx (symmetric tonsillar
hypertrophy), parapharyngeal spaces, retropharyngeal space,
sublingual space, submandibular glands, and parotid glands are
within normal limits. No cervical lymphadenopathy.

Aortic arch: 3 vessel arch configuration. No great vessel origin
stenosis. Mild arch and proximal great vessel calcified plaque.

Right carotid system: Negative right CCA proximal to the right
carotid bifurcation. Soft and calcified plaque at the right carotid
bifurcation, no right ICA origin stenosis. In the distal bulb there
is more extensive soft and calcified plaque resulting in stenosis up
to 50 % with respect to the distal vessel (series 604, image 75).
Negative cervical right ICA otherwise.

Left carotid system: Proximal to the left carotid bifurcation there
is minimal left CCA soft plaque. Soft and calcified plaque at the
carotid bifurcation with no left ICA origin stenosis. Soft and
calcified plaque intermittently throughout the bulb resulting in
less than 50 % stenosis with respect to the distal vessel. Negative
cervical left ICA otherwise.

Vertebral arteries:No proximal right subclavian artery stenosis.
Normal right vertebral artery origin. Mildly non dominant right
vertebral artery is patent to the skullbase without stenosis.

No proximal left subclavian artery stenosis. Left vertebral artery
origin within normal limits. Mildly dominant left vertebral artery
is patent to the skullbase without stenosis. There is minimal left
V3 calcified plaque.

CTA HEAD

Posterior circulation: No V4 segment stenosis. Normal PICA origins.
Patent vertebrobasilar junction. Patent basilar artery without
stenosis. Normal SCA and PCA origins. Both posterior communicating
arteries are present. Bilateral PCA branches are within normal
limits.

Anterior circulation: Both ICA siphons are patent. Mild to moderate
calcified plaque bilaterally with no siphon stenosis. Ophthalmic and
posterior communicating artery origins are normal. Normal carotid
termini. Normal MCA and ACA origins.

Anterior communicating artery and bilateral ACA branches are within
normal limits. Left MCA M1 segment, bifurcation, and left MCA
branches are within normal limits. Right MCA M1 segment,
bifurcation, and right MCA branches are within normal limits.

Venous sinuses: Patent.

Anatomic variants: Slightly dominant left vertebral artery.

Delayed phase: No abnormal enhancement identified.
IMPRESSION: 1. Bilateral carotid bifurcation and ICA bulb atherosclerosis with
up to 50 percent stenosis in the distal right ICA bulb, less than
50% stenosis in the left ICA.
2. Bilateral ICA siphon calcified plaque without stenosis. Otherwise
negative anterior circulation.
3. Minimal vertebral artery calcified plaque without stenosis.
Negative posterior circulation.
4.  Normal noncontrast CT appearance of the brain.
5. No acute findings in the neck.

## 2016-05-20 ENCOUNTER — Ambulatory Visit (INDEPENDENT_AMBULATORY_CARE_PROVIDER_SITE_OTHER): Payer: Self-pay | Admitting: *Deleted

## 2016-05-20 DIAGNOSIS — I639 Cerebral infarction, unspecified: Secondary | ICD-10-CM

## 2016-05-20 NOTE — Progress Notes (Signed)
Carelink Summary Report / Loop Recorder 

## 2016-05-23 LAB — CUP PACEART REMOTE DEVICE CHECK
Date Time Interrogation Session: 20180401080712
Implantable Pulse Generator Implant Date: 20150814

## 2016-06-18 ENCOUNTER — Ambulatory Visit (INDEPENDENT_AMBULATORY_CARE_PROVIDER_SITE_OTHER): Payer: Self-pay | Admitting: *Deleted

## 2016-06-18 DIAGNOSIS — I639 Cerebral infarction, unspecified: Secondary | ICD-10-CM

## 2016-06-18 NOTE — Progress Notes (Signed)
Carelink Summary Report / Loop Recorder 

## 2016-06-30 LAB — CUP PACEART REMOTE DEVICE CHECK
Date Time Interrogation Session: 20180501083815
MDC IDC PG IMPLANT DT: 20150814

## 2016-06-30 NOTE — Progress Notes (Signed)
Carelink summary report received. Battery status OK. Normal device function. No new symptom episodes, tachy episodes, brady, or pause episodes. No new AF episodes. Monthly summary reports and ROV/PRN 

## 2016-07-18 ENCOUNTER — Ambulatory Visit (INDEPENDENT_AMBULATORY_CARE_PROVIDER_SITE_OTHER): Payer: Self-pay | Admitting: *Deleted

## 2016-07-18 DIAGNOSIS — I639 Cerebral infarction, unspecified: Secondary | ICD-10-CM

## 2016-07-18 NOTE — Progress Notes (Signed)
Carelink Summary Report 

## 2016-07-20 LAB — CUP PACEART REMOTE DEVICE CHECK
Implantable Pulse Generator Implant Date: 20150814
MDC IDC SESS DTM: 20180531083929

## 2016-07-20 NOTE — Progress Notes (Signed)
Carelink summary report received. Battery status OK. Normal device function. No new symptom episodes, tachy episodes, brady, or pause episodes. No new AF episodes. Monthly summary reports and ROV/PRN 

## 2016-08-09 ENCOUNTER — Encounter: Payer: Self-pay | Admitting: Cardiovascular Disease

## 2016-08-19 ENCOUNTER — Ambulatory Visit (INDEPENDENT_AMBULATORY_CARE_PROVIDER_SITE_OTHER): Payer: Self-pay | Admitting: *Deleted

## 2016-08-19 DIAGNOSIS — I639 Cerebral infarction, unspecified: Secondary | ICD-10-CM

## 2016-08-19 NOTE — Progress Notes (Signed)
Carelink Summary Report / Loop Recorder 

## 2016-08-28 ENCOUNTER — Encounter: Payer: Self-pay | Admitting: *Deleted

## 2016-08-28 ENCOUNTER — Telehealth: Payer: Self-pay | Admitting: Cardiovascular Disease

## 2016-08-28 ENCOUNTER — Ambulatory Visit (INDEPENDENT_AMBULATORY_CARE_PROVIDER_SITE_OTHER): Payer: BLUE CROSS/BLUE SHIELD | Admitting: Cardiovascular Disease

## 2016-08-28 ENCOUNTER — Encounter: Payer: Self-pay | Admitting: Cardiovascular Disease

## 2016-08-28 VITALS — BP 138/80 | HR 74 | Ht 70.0 in | Wt 278.0 lb

## 2016-08-28 DIAGNOSIS — E785 Hyperlipidemia, unspecified: Secondary | ICD-10-CM | POA: Diagnosis not present

## 2016-08-28 DIAGNOSIS — I25708 Atherosclerosis of coronary artery bypass graft(s), unspecified, with other forms of angina pectoris: Secondary | ICD-10-CM

## 2016-08-28 DIAGNOSIS — Z951 Presence of aortocoronary bypass graft: Secondary | ICD-10-CM

## 2016-08-28 DIAGNOSIS — I10 Essential (primary) hypertension: Secondary | ICD-10-CM

## 2016-08-28 DIAGNOSIS — Z8673 Personal history of transient ischemic attack (TIA), and cerebral infarction without residual deficits: Secondary | ICD-10-CM | POA: Diagnosis not present

## 2016-08-28 MED ORDER — ALIROCUMAB 75 MG/ML ~~LOC~~ SOPN
75.0000 mg | PEN_INJECTOR | SUBCUTANEOUS | 3 refills | Status: DC
Start: 2016-08-28 — End: 2016-09-02

## 2016-08-28 NOTE — Patient Instructions (Addendum)
Medication Instructions:   Begin Praluent 75mg  every 2 weeks.  Continue all other medications.    Labwork: none  Testing/Procedures:  Your physician has requested that you have en exercise stress myoview. For further information please visit [email or web address]. Please follow instruction sheet, as given.  Office will contact with results via phone or letter.    Follow-Up: Your physician wants you to follow up in: 6 months.  You will receive a reminder letter in the mail one-two months in advance.  If you don't receive a letter, please call our office to schedule the follow up appointment   Any Other Special Instructions Will Be Listed Below (If Applicable).  If you need a refill on your cardiac medications before your next appointment, please call your pharmacy.

## 2016-08-28 NOTE — Progress Notes (Signed)
CARDIOLOGY CONSULT NOTE  Patient ID: Justin Sexton MRN: [id number] DOB/AGE: Sep 16, 1960 56 y.o.  Admit date: (Not on file) Primary Physician: Glenda Chroman, MD Referring Physician: Woody Seller  Reason for Consultation: CAD  HPI: Justin Sexton is a 56 y.o. male who is being seen today for the evaluation of CAD at the request of Vyas, Dhruv B, MD.   He has a history of 4 vessel CABG in October 1996 with a right internal mammary artery graft to the RCA, left internal mammary artery graft to the LAD, and saphenous vein grafts to the obtuse marginal and ramus intermedius. He also has a history of CVA and is on indefinite dual antiplatelet therapy with aspirin and Plavix. He previously underwent implantation of a loop recorder to rule out atrial fibrillation and no a fib has been detected.  I personally reviewed all office documentation, labs, and studies.  Carotid Dopplers on 08/12/16 showed 1-50% bilateral stenosis more so on the left.  Labs 08/09/16: Hemoglobin 14.7, platelets 222, TSH 1.1. BUN 21, creatinine 0.83, total cholesterol 190, triglycerides 122, HDL 41, LDL 125.  Most recent echocardiogram dated 01/20/14 demonstrated normal left ventricular systolic function, LVEF 78-29%, with grade 1 diastolic dysfunction.  He denies chest pain, palpitations, shortness of breath, leg swelling.  If he stands up too quickly after squatting he gets lightheaded. He denies syncope.  ECG performed in the office today which I ordered an personally interpreted demonstrated sinus rhythm with old inferior infarct.  Allergies  Allergen Reactions  . Codeine Nausea And Vomiting    Current Outpatient Prescriptions  Medication Sig Dispense Refill  . aspirin EC 81 MG tablet Take 81 mg by mouth daily.    . clopidogrel (PLAVIX) 75 MG tablet TAKE 1 TABLET BY MOUTH DAILY - PT NEEDS APPOINTMENT 30 tablet 0  . Coenzyme Q10 (CO Q-10) 100 MG CAPS Take by mouth.    Marland Kitchen lisinopril (PRINIVIL,ZESTRIL) 10 MG tablet  Take 10 mg by mouth daily.     Marland Kitchen loratadine (CLARITIN) 10 MG tablet Take by mouth.    . metoprolol tartrate (LOPRESSOR) 25 MG tablet Take 12.5 mg by mouth 2 (two) times daily.     . mometasone (NASONEX) 50 MCG/ACT nasal spray Place 2 sprays into both nostrils daily as needed (for allergies).     Marland Kitchen NITROSTAT 0.4 MG SL tablet Place 0.4 mg under the tongue every 5 (five) minutes as needed for chest pain.     . ranitidine (ZANTAC) 150 MG capsule Take 150 mg by mouth daily.    . rosuvastatin (CRESTOR) 40 MG tablet Take 40 mg by mouth every morning.     No current facility-administered medications for this visit.     Past Medical History:  Diagnosis Date  . CAD (coronary artery disease), native coronary artery 10/96   4 Vessel   . Chronic sinusitis   . Essential hypertension   . Gout   . History of stroke    August 2015 - Small acute posterior left frontal lobe infarcts   . Hyperlipidemia   . Kidney stone   . Stroke ([place])   . TIA (transient ischemic attack)     Past Surgical History:  Procedure Laterality Date  . COLONOSCOPY N/A 09/03/2012   Procedure: COLONOSCOPY;  Surgeon: Rogene Houston, MD;  [facility];  Service: Endoscopy;  Laterality: N/A;  1200  . CORONARY ARTERY BYPASS GRAFT  12/12/1994   LIMA to LAD, RIMA to mid RCA,  reverse SVG to intermediate branch of left coronary artery.   . Implantable loop recorder  August 2015   Dr. Rayann Heman A Medtronic Reveal [place] model [place] SN [id number] implantable loop recorder was then placed into the pocket  R waves were very prominent and measured 0.51mV  . LOOP RECORDER IMPLANT N/A 10/01/2013   Procedure: LOOP RECORDER IMPLANT;  Surgeon: Coralyn Mark, MD;  Location: [facility] CATH LAB;  Service: Cardiovascular;  Laterality: N/A;  . Loop recorder interrogation  Novemeber 17,  2015   Carelink summary report received. Battery status OK. Normal device function. No symptom episodes, tachy episodes, brady or pause episodes.   . TEE WITHOUT  CARDIOVERSION N/A 10/01/2013   Procedure: TRANSESOPHAGEAL ECHOCARDIOGRAM (TEE);  Surgeon: Sanda Klein, MD;  [facility] ENDOSCOPY;  Service: Cardiovascular;  Laterality: N/A;  loop to follow    Social History   Social History  . Marital status: Married    Spouse name: N/A  . Number of children: 2  . Years of education: 12th   Occupational History  . OWNER Administrator, [employer]    Social History Main Topics  . Smoking status: Former Smoker    Types: Cigarettes    Quit date: 07/20/1994  . Smokeless tobacco: Never Used  . Alcohol use No  . Drug use: No  . Sexual activity: Not on file   Other Topics Concern  . Not on file   Social History Narrative   Patient is married with 2 children.   Patient is right handed.   Patient has hs education.   Patient drinks 8-10 drinks daily.     No family history of premature CAD in 1st degree relatives.  Current Meds  Medication Sig  . aspirin EC 81 MG tablet Take 81 mg by mouth daily.  . clopidogrel (PLAVIX) 75 MG tablet TAKE 1 TABLET BY MOUTH DAILY - PT NEEDS APPOINTMENT  . Coenzyme Q10 (CO Q-10) 100 MG CAPS Take by mouth.  Marland Kitchen lisinopril (PRINIVIL,ZESTRIL) 10 MG tablet Take 10 mg by mouth daily.   Marland Kitchen loratadine (CLARITIN) 10 MG tablet Take by mouth.  . metoprolol tartrate (LOPRESSOR) 25 MG tablet Take 12.5 mg by mouth 2 (two) times daily.   . mometasone (NASONEX) 50 MCG/ACT nasal spray Place 2 sprays into both nostrils daily as needed (for allergies).   Marland Kitchen NITROSTAT 0.4 MG SL tablet Place 0.4 mg under the tongue every 5 (five) minutes as needed for chest pain.   . ranitidine (ZANTAC) 150 MG capsule Take 150 mg by mouth daily.  . rosuvastatin (CRESTOR) 40 MG tablet Take 40 mg by mouth every morning.      Review of systems complete and found to be negative unless listed above in HPI    Physical exam Blood pressure 138/80, pulse 74, height 5\' 10"  (1.778 m), weight 278 lb (126.1 kg), SpO2 94 %. General:  NAD Neck: No JVD, no thyromegaly or thyroid nodule.  Lungs: Clear to auscultation bilaterally with normal respiratory effort. CV: Nondisplaced PMI. Regular rate and rhythm, normal S1/S2, no S3/S4, no murmur.  No peripheral edema.  No carotid bruit.    Abdomen: Soft, nontender, no distention.  Skin: Intact without lesions or rashes.  Neurologic: Alert and oriented x 3.  Psych: Normal affect. Extremities: No clubbing or cyanosis.  HEENT: Normal.   ECG: Most recent ECG reviewed.   Labs: Lab Results  Component Value Date/Time   K 4.0 07/18/2014 08:58 PM   BUN 23 (H) 07/18/2014  08:58 PM   CREATININE 1.13 07/18/2014 08:58 PM   ALT 25 07/18/2014 08:58 PM   HGB 14.4 07/18/2014 08:58 PM     Lipids: Lab Results  Component Value Date/Time   LDLCALC 97 01/20/2014 05:56 AM   CHOL 155 01/20/2014 05:56 AM   TRIG 124 01/20/2014 05:56 AM   HDL 33 (L) 01/20/2014 05:56 AM        ASSESSMENT AND PLAN:  1. Coronary artery disease with history of 4 vessel CABG: Symptomatically stable. As it has been 22 years since bypass surgery, I will obtain an exercise Myoview nuclear stress test for ischemic surveillance purposes. Continue aspirin, Plavix, lisinopril, metoprolol, and Crestor. As LDL remains elevated, I will start Praluent.  2. Hypertension: Mildly elevated. No changes.  3. Hyperlipidemia: Currently on Crestor 40 mg with elevated LDL. I will start Praluent.  Disposition: Follow up in 6 months  Signed: Kate Sable, M.D., F.A.C.C.  08/28/2016, 2:37 PM

## 2016-08-29 NOTE — Telephone Encounter (Signed)
Pre-cert Verification for the following procedure   Lexiscan Myoview scheduled for 09-05-16 at [facility].

## 2016-09-02 ENCOUNTER — Other Ambulatory Visit: Payer: Self-pay | Admitting: *Deleted

## 2016-09-02 MED ORDER — ALIROCUMAB 75 MG/ML ~~LOC~~ SOPN
75.0000 mg | PEN_INJECTOR | SUBCUTANEOUS | 3 refills | Status: DC
Start: 2016-09-02 — End: 2017-05-07

## 2016-09-04 LAB — CUP PACEART REMOTE DEVICE CHECK
Date Time Interrogation Session: 20180630101254
MDC IDC PG IMPLANT DT: 20150814

## 2016-09-05 ENCOUNTER — Encounter (HOSPITAL_COMMUNITY): Admission: RE | Admit: 2016-09-05 | Payer: BLUE CROSS/BLUE SHIELD | Source: Ambulatory Visit

## 2016-09-05 ENCOUNTER — Encounter (HOSPITAL_COMMUNITY)
Admission: RE | Admit: 2016-09-05 | Discharge: 2016-09-05 | Disposition: A | Discharge status: Home / Self Care | Payer: BLUE CROSS/BLUE SHIELD | Source: Ambulatory Visit | Attending: Cardiovascular Disease | Admitting: Cardiovascular Disease

## 2016-09-05 DIAGNOSIS — Z951 Presence of aortocoronary bypass graft: Secondary | ICD-10-CM | POA: Insufficient documentation

## 2016-09-06 ENCOUNTER — Encounter (HOSPITAL_COMMUNITY): Payer: Self-pay

## 2016-09-06 ENCOUNTER — Encounter (HOSPITAL_BASED_OUTPATIENT_CLINIC_OR_DEPARTMENT_OTHER)
Admission: RE | Admit: 2016-09-06 | Discharge: 2016-09-06 | Disposition: A | Discharge status: Home / Self Care | Payer: BLUE CROSS/BLUE SHIELD | Source: Ambulatory Visit | Attending: Cardiovascular Disease | Admitting: Cardiovascular Disease

## 2016-09-06 ENCOUNTER — Encounter (HOSPITAL_COMMUNITY)
Admission: RE | Admit: 2016-09-06 | Discharge: 2016-09-06 | Disposition: A | Discharge status: Home / Self Care | Payer: BLUE CROSS/BLUE SHIELD | Source: Ambulatory Visit | Attending: Cardiovascular Disease | Admitting: Cardiovascular Disease

## 2016-09-06 DIAGNOSIS — Z951 Presence of aortocoronary bypass graft: Secondary | ICD-10-CM | POA: Diagnosis not present

## 2016-09-06 DIAGNOSIS — I25708 Atherosclerosis of coronary artery bypass graft(s), unspecified, with other forms of angina pectoris: Secondary | ICD-10-CM | POA: Diagnosis not present

## 2016-09-06 LAB — NM MYOCAR MULTI W/SPECT W/WALL MOTION / EF
CHL CUP MPHR: 165 {beats}/min
CHL CUP NUCLEAR SSS: 6
CHL RATE OF PERCEIVED EXERTION: 16
CSEPED: 7 min
CSEPHR: 87 %
Estimated workload: 10.1 METS
Exercise duration (sec): 46 s
LV dias vol: 141 mL (ref 62–150)
LV sys vol: 62 mL
NUC STRESS TID: 1.06
Peak HR: 144 {beats}/min
RATE: 0.38
Rest HR: 68 {beats}/min
SDS: 6
SRS: 0

## 2016-09-06 MED ORDER — SODIUM CHLORIDE 0.9% FLUSH
INTRAVENOUS | Status: AC
  Administered 2016-09-06: 10 mL via INTRAVENOUS
  Filled 2016-09-06: qty 10

## 2016-09-06 MED ORDER — TECHNETIUM TC 99M TETROFOSMIN IV KIT
10.0000 | PACK | Freq: Once | INTRAVENOUS | Status: AC | PRN
  Administered 2016-09-06: 10 via INTRAVENOUS

## 2016-09-06 MED ORDER — TECHNETIUM TC 99M TETROFOSMIN IV KIT
30.0000 | PACK | Freq: Once | INTRAVENOUS | Status: AC | PRN
Start: 2016-09-06 — End: 2016-09-06
  Administered 2016-09-06: 33 via INTRAVENOUS

## 2016-09-06 MED ORDER — REGADENOSON 0.4 MG/5ML IV SOLN
INTRAVENOUS | Status: AC
  Filled 2016-09-06: qty 5

## 2016-09-09 ENCOUNTER — Ambulatory Visit (INDEPENDENT_AMBULATORY_CARE_PROVIDER_SITE_OTHER): Payer: BLUE CROSS/BLUE SHIELD | Admitting: Otolaryngology

## 2016-09-09 DIAGNOSIS — H9042 Sensorineural hearing loss, unilateral, left ear, with unrestricted hearing on the contralateral side: Secondary | ICD-10-CM

## 2016-09-09 DIAGNOSIS — H9312 Tinnitus, left ear: Secondary | ICD-10-CM

## 2016-09-10 ENCOUNTER — Other Ambulatory Visit (INDEPENDENT_AMBULATORY_CARE_PROVIDER_SITE_OTHER): Payer: Self-pay | Admitting: Otolaryngology

## 2016-09-10 DIAGNOSIS — H918X9 Other specified hearing loss, unspecified ear: Secondary | ICD-10-CM

## 2016-09-11 ENCOUNTER — Telehealth: Payer: Self-pay | Admitting: *Deleted

## 2016-09-11 NOTE — Telephone Encounter (Signed)
-----   Message from Drema Dallas, [place] sent at 09/11/2016  1:33 PM EDT ----- Ledell Noss pt.  ----- Message ----- From: Arnoldo Lenis, MD Sent: 09/11/2016   1:31 PM To: Drema Dallas, CMA  Stress test looks good, no evidence of blockages  J BrancH MD

## 2016-09-11 NOTE — Telephone Encounter (Signed)
Patient informed. 

## 2016-09-16 ENCOUNTER — Ambulatory Visit (INDEPENDENT_AMBULATORY_CARE_PROVIDER_SITE_OTHER): Payer: BLUE CROSS/BLUE SHIELD | Admitting: *Deleted

## 2016-09-16 DIAGNOSIS — I639 Cerebral infarction, unspecified: Secondary | ICD-10-CM

## 2016-09-16 NOTE — Progress Notes (Signed)
Carelink Summary Report / Loop Recorder 

## 2016-09-17 ENCOUNTER — Telehealth: Payer: Self-pay | Admitting: *Deleted

## 2016-09-17 NOTE — Telephone Encounter (Signed)
[employer] approved Praluent 75mg /ml - 08/29/2016 to 08/24/2017.

## 2016-09-19 ENCOUNTER — Ambulatory Visit (HOSPITAL_COMMUNITY)
Admission: RE | Admit: 2016-09-19 | Discharge: 2016-09-19 | Disposition: A | Discharge status: Home / Self Care | Payer: BLUE CROSS/BLUE SHIELD | Source: Ambulatory Visit | Attending: Otolaryngology | Admitting: Otolaryngology

## 2016-09-19 DIAGNOSIS — H918X9 Other specified hearing loss, unspecified ear: Secondary | ICD-10-CM | POA: Diagnosis not present

## 2016-09-19 LAB — POCT I-STAT CREATININE: Creatinine, Ser: 0.9 mg/dL (ref 0.61–1.24)

## 2016-09-19 MED ORDER — GADOBENATE DIMEGLUMINE 529 MG/ML IV SOLN
20.0000 mL | Freq: Once | INTRAVENOUS | Status: AC | PRN
  Administered 2016-09-19: 20 mL via INTRAVENOUS

## 2016-09-28 LAB — CUP PACEART REMOTE DEVICE CHECK
Implantable Pulse Generator Implant Date: 20150814
MDC IDC SESS DTM: 20180730124301

## 2016-09-28 NOTE — Progress Notes (Signed)
Carelink summary report received. Battery status OK. Normal device function. No new symptom episodes, tachy episodes, brady, or pause episodes. No new AF episodes. Monthly summary reports and ROV/PRN 

## 2016-10-16 ENCOUNTER — Ambulatory Visit (INDEPENDENT_AMBULATORY_CARE_PROVIDER_SITE_OTHER): Payer: BLUE CROSS/BLUE SHIELD | Admitting: *Deleted

## 2016-10-16 DIAGNOSIS — I639 Cerebral infarction, unspecified: Secondary | ICD-10-CM | POA: Diagnosis not present

## 2016-10-17 LAB — CUP PACEART REMOTE DEVICE CHECK
Date Time Interrogation Session: 20180829234120
MDC IDC PG IMPLANT DT: 20150814

## 2016-10-17 NOTE — Progress Notes (Signed)
Carelink Summary Report / Loop Recorder 

## 2016-11-15 ENCOUNTER — Ambulatory Visit (INDEPENDENT_AMBULATORY_CARE_PROVIDER_SITE_OTHER): Payer: BLUE CROSS/BLUE SHIELD | Admitting: *Deleted

## 2016-11-15 ENCOUNTER — Telehealth: Payer: Self-pay | Admitting: *Deleted

## 2016-11-15 DIAGNOSIS — I639 Cerebral infarction, unspecified: Secondary | ICD-10-CM | POA: Diagnosis not present

## 2016-11-15 NOTE — Telephone Encounter (Signed)
LMOVM requesting call back to the [facility].  Gave direct phone number for return call.  LINQ at RRT as of 11/14/16, will discuss options with patient.

## 2016-11-18 NOTE — Progress Notes (Signed)
Carelink Summary Report / Loop Recorder 

## 2016-11-19 LAB — CUP PACEART REMOTE DEVICE CHECK
Implantable Pulse Generator Implant Date: 20150814
MDC IDC SESS DTM: 20180929001129

## 2016-11-20 NOTE — Telephone Encounter (Signed)
LVMOM regarding LINQ at RRT since 11/14/16. [facility] phone number to call back.

## 2016-11-28 NOTE — Telephone Encounter (Signed)
LMOVM requesting call back to [facility]. 

## 2016-12-06 NOTE — Telephone Encounter (Signed)
LMOVM requesting a call back to the [facility]. [facility] hone number.

## 2016-12-13 ENCOUNTER — Encounter: Payer: Self-pay | Admitting: *Deleted

## 2016-12-13 NOTE — Telephone Encounter (Signed)
Mailed letter requesting call to the [facility].  Unenrolled from [place], return kit ordered.

## 2016-12-25 ENCOUNTER — Other Ambulatory Visit: Payer: Self-pay | Admitting: Internal Medicine

## 2017-02-14 ENCOUNTER — Telehealth: Payer: Self-pay | Admitting: Pharmacist Clinician (PhC)/ Clinical Pharmacy Specialist

## 2017-02-14 DIAGNOSIS — E785 Hyperlipidemia, unspecified: Secondary | ICD-10-CM

## 2017-02-14 NOTE — Telephone Encounter (Signed)
Spoke with patient wife - no changes to insurance for 2019.  Need to get updated labs in order to renew PA for 2019.    Patient will get these done in next week or two

## 2017-02-27 ENCOUNTER — Encounter: Payer: Self-pay | Admitting: *Deleted

## 2017-03-25 ENCOUNTER — Telehealth: Payer: Self-pay | Admitting: *Deleted

## 2017-03-25 NOTE — Telephone Encounter (Signed)
The following message was received:  Laurine Blazer, LPN  Verble Styron, Susette Racer, RN; Mechele Dawley, RN        Could you guys contact patient in regards to next remote check.   Looks like you guys tried to make contact with him in September with no luck.    Please call the wife Velva Harman) at [phone]. She is on his designated release. She stated that he is gone a lot for work, so she will make sure he does what he needs to.    Thank you,   G. Hill, [place] called Ms. Nevada Crane- she then put Mr. Conely on the phone. Mr. Shrey Boike reached RRT in September 2018 and we were unable to reach the patient (x4) to discuss the next steps. A monitor return kit was ordered from [employer]- patient reports he never received this. I will order another one. No further monitoring at this time and the patient is electing to leave the [facility] implant in place.

## 2017-04-09 ENCOUNTER — Encounter: Payer: Self-pay | Admitting: *Deleted

## 2017-04-10 NOTE — Telephone Encounter (Addendum)
Attempted to notify patient multiple times in regards to needing his Lipids labs done.  Most recently spoke to his wife Velva Harman) on 03/25/2017 - stated she would have him get the labwork that week.  As of current date (04/10/17), no labs have been received.  At this point, will not pursue this any further.  If patient chooses to participate in the future, we can resume this process.

## 2017-04-29 ENCOUNTER — Ambulatory Visit: Payer: BLUE CROSS/BLUE SHIELD | Admitting: Cardiovascular Disease

## 2017-05-07 ENCOUNTER — Ambulatory Visit: Payer: BLUE CROSS/BLUE SHIELD | Admitting: Cardiovascular Disease

## 2017-05-07 ENCOUNTER — Other Ambulatory Visit: Payer: Self-pay

## 2017-05-07 ENCOUNTER — Encounter: Payer: Self-pay | Admitting: Cardiovascular Disease

## 2017-05-07 VITALS — BP 137/79 | HR 62 | Ht 70.0 in | Wt 281.0 lb

## 2017-05-07 DIAGNOSIS — Z8673 Personal history of transient ischemic attack (TIA), and cerebral infarction without residual deficits: Secondary | ICD-10-CM

## 2017-05-07 DIAGNOSIS — I25708 Atherosclerosis of coronary artery bypass graft(s), unspecified, with other forms of angina pectoris: Secondary | ICD-10-CM

## 2017-05-07 DIAGNOSIS — I10 Essential (primary) hypertension: Secondary | ICD-10-CM | POA: Diagnosis not present

## 2017-05-07 DIAGNOSIS — I6523 Occlusion and stenosis of bilateral carotid arteries: Secondary | ICD-10-CM | POA: Diagnosis not present

## 2017-05-07 DIAGNOSIS — E785 Hyperlipidemia, unspecified: Secondary | ICD-10-CM

## 2017-05-07 MED ORDER — CLOPIDOGREL BISULFATE 75 MG PO TABS: ORAL_TABLET | ORAL | 1 refills | Status: DC

## 2017-05-07 MED ORDER — NITROGLYCERIN 0.4 MG SL SUBL: 0.4000 mg | SUBLINGUAL_TABLET | SUBLINGUAL | 3 refills | Status: AC | PRN

## 2017-05-07 NOTE — Patient Instructions (Signed)
Your physician wants you to follow-up in: [place] will receive a reminder letter in the mail two months in advance. If you don't receive a letter, please call our office to schedule the follow-up appointment.  Your physician recommends that you continue on your current medications as directed. Please refer to the Current Medication list given to you today.  Your physician recommends that you return for lab work NOW AND IN [place] FAST 6-8 HOURS PRIOR TO LAB WORK  Thank you for choosing [facility]!!

## 2017-05-07 NOTE — Progress Notes (Signed)
SUBJECTIVE: The patient presents for follow-up of coronary artery disease with a history of four-vessel CABG.  Nuclear stress test on 09/06/16 was low risk, LVEF 56%, with no significant ischemia or scar.  Most recent echocardiogram dated 01/20/14 demonstrated normal left ventricular systolic function, LVEF 51-88%, with grade 1 diastolic dysfunction.  He has an implantable loop recorder for a history of cryptogenic stroke. He is on indefinite dual antiplatelet therapy with aspirin and Plavix.   I reviewed his most recent device interrogation which did not demonstrate any evidence of atrial fibrillation.  He has a history of 4 vessel CABG in October 1996 with a right internal mammary artery graft to the RCA, left internal mammary artery graft to the LAD, and saphenous vein grafts to the obtuse marginal and ramus intermedius.  The patient denies any symptoms of chest pain, palpitations, shortness of breath, lightheadedness, dizziness, leg swelling, orthopnea, PND, and syncope.  He took Praluent for some time but stopped taking it about mid February due to diffuse body pains.  Since stopping it his symptoms have resolved.  He thinks his LDL may have been elevated at his last visit with me because he was not taking Crestor routinely.  His wife discovered this.    Review of Systems: As per "subjective", otherwise negative.  Allergies  Allergen Reactions  . Codeine Nausea And Vomiting    Current Outpatient Medications  Medication Sig Dispense Refill  . aspirin EC 81 MG tablet Take 81 mg by mouth daily.    . clopidogrel (PLAVIX) 75 MG tablet TAKE 1 TABLET BY MOUTH DAILY - PT NEEDS APPOINTMENT 30 tablet 0  . Coenzyme Q10 (CO Q-10) 100 MG CAPS Take by mouth.    Marland Kitchen lisinopril (PRINIVIL,ZESTRIL) 10 MG tablet Take 10 mg by mouth daily.     Marland Kitchen loratadine (CLARITIN) 10 MG tablet Take by mouth.    . metoprolol tartrate (LOPRESSOR) 25 MG tablet Take 12.5 mg by mouth 2 (two) times daily.     .  mometasone (NASONEX) 50 MCG/ACT nasal spray Place 2 sprays into both nostrils daily as needed (for allergies).     Marland Kitchen NITROSTAT 0.4 MG SL tablet Place 0.4 mg under the tongue every 5 (five) minutes as needed for chest pain.     . ranitidine (ZANTAC) 150 MG capsule Take 150 mg by mouth daily.    . rosuvastatin (CRESTOR) 40 MG tablet Take 40 mg by mouth every morning.     No current facility-administered medications for this visit.     Past Medical History:  Diagnosis Date  . CAD (coronary artery disease), native coronary artery 10/96   4 Vessel   . Chronic sinusitis   . Essential hypertension   . Gout   . History of stroke    August 2015 - Small acute posterior left frontal lobe infarcts   . Hyperlipidemia   . Kidney stone   . Stroke ([place])   . TIA (transient ischemic attack)     Past Surgical History:  Procedure Laterality Date  . COLONOSCOPY N/A 09/03/2012   Procedure: COLONOSCOPY;  Surgeon: Rogene Houston, MD;  [facility];  Service: Endoscopy;  Laterality: N/A;  1200  . CORONARY ARTERY BYPASS GRAFT  12/12/1994   LIMA to LAD, RIMA to mid RCA, reverse SVG to intermediate branch of left coronary artery.   . Implantable loop recorder  August 2015   Dr. Rayann Heman A Medtronic Reveal [place] model [place] SN [id number] S implantable loop recorder was  then placed into the pocket  R waves were very prominent and measured 0.55mV  . LOOP RECORDER IMPLANT N/A 10/01/2013   Procedure: LOOP RECORDER IMPLANT;  Surgeon: Coralyn Mark, MD;  Location: [facility] CATH LAB;  Service: Cardiovascular;  Laterality: N/A;  . Loop recorder interrogation  Novemeber 17,  2015   Carelink summary report received. Battery status OK. Normal device function. No symptom episodes, tachy episodes, brady or pause episodes.   . TEE WITHOUT CARDIOVERSION N/A 10/01/2013   Procedure: TRANSESOPHAGEAL ECHOCARDIOGRAM (TEE);  Surgeon: Sanda Klein, MD;  Location[facility] ENDOSCOPY;  Service: Cardiovascular;  Laterality: N/A;  loop to  follow    Social History   Socioeconomic History  . Marital status: Married    Spouse name: Not on file  . Number of children: 2  . Years of education: 12th  . Highest education level: Not on file  Social Needs  . Financial resource strain: Not on file  . Food insecurity - worry: Not on file  . Food insecurity - inability: Not on file  . Transportation needs - medical: Not on file  . Transportation needs - non-medical: Not on file  Occupational History  . Occupation: OWNER    Employer: [employer]     CommentNurse, mental health   Tobacco Use  . Smoking status: Former Smoker    Types: Cigarettes    Last attempt to quit: 07/20/1994    Years since quitting: 22.8  . Smokeless tobacco: Never Used  Substance and Sexual Activity  . Alcohol use: No    Alcohol/week: 0.0 oz  . Drug use: No  . Sexual activity: Not on file  Other Topics Concern  . Not on file  Social History Narrative   Patient is married with 2 children.   Patient is right handed.   Patient has hs education.   Patient drinks 8-10 drinks daily.     Vitals:   05/07/17 1106  BP: 137/79  Pulse: 62  SpO2: 96%  Weight: 281 lb (127.5 kg)  Height: 5\' 10"  (1.778 m)    Wt Readings from Last 3 Encounters:  05/07/17 281 lb (127.5 kg)  08/28/16 278 lb (126.1 kg)  11/21/14 279 lb (126.6 kg)     PHYSICAL EXAM General: NAD HEENT: Normal. Neck: No JVD, no thyromegaly. Lungs: Clear to auscultation bilaterally with normal respiratory effort. CV: Regular rate and rhythm, normal S1/S2, no S3/S4, no murmur. No pretibial or periankle edema.  No carotid bruit.   Abdomen: Soft, nontender, no distention.  Neurologic: Alert and oriented.  Psych: Normal affect. Skin: Normal. Musculoskeletal: No gross deformities.    ECG: Most recent ECG reviewed.   Labs: Lab Results  Component Value Date/Time   K 4.0 07/18/2014 08:58 PM   BUN 23 (H) 07/18/2014 08:58 PM   CREATININE 0.90 09/19/2016 01:12 PM   ALT 25  07/18/2014 08:58 PM   HGB 14.4 07/18/2014 08:58 PM     Lipids: Lab Results  Component Value Date/Time   LDLCALC 97 01/20/2014 05:56 AM   CHOL 155 01/20/2014 05:56 AM   TRIG 124 01/20/2014 05:56 AM   HDL 33 (L) 01/20/2014 05:56 AM       ASSESSMENT AND PLAN: 1. Coronary artery disease with history of 4 vessel CABG: Symptomatically stable.  Nuclear stress test was low risk as detailed above. Continue aspirin, Plavix, lisinopril, metoprolol, and Crestor.    2. Hypertension:  Blood pressure is normal.  No changes to therapy.  3. Hyperlipidemia: He took Praluent for some  time but stopped taking it about mid February due to diffuse body pains.  Since stopping it his symptoms have resolved.  He thinks his LDL may have been elevated at his last visit with me because he was not taking Crestor routinely.  His wife discovered this. I will check a lipid panel now and again in 6 months.  4.  Bilateral carotid artery stenosis: I reviewed his Dopplers performed in June 2018.  It revealed less than 50% stenosis bilaterally.  He remains on aspirin, Plavix, and Crestor.  5.  History of cryptogenic stroke: Loop recorder interrogation reviewed above with no evidence of atrial fibrillation.  He remains on aspirin, Plavix, and Crestor.     Disposition: Follow up 1 year   Kate Sable, M.D., F.A.C.C.

## 2017-12-28 ENCOUNTER — Other Ambulatory Visit: Payer: Self-pay | Admitting: Cardiovascular Disease

## 2017-12-28 DIAGNOSIS — Z8673 Personal history of transient ischemic attack (TIA), and cerebral infarction without residual deficits: Secondary | ICD-10-CM

## 2018-05-07 ENCOUNTER — Telehealth: Payer: Self-pay | Admitting: Cardiovascular Disease

## 2018-05-07 NOTE — Telephone Encounter (Signed)
Please reschedule appointment  Received: Yesterday  Message Contents  Herminio Commons, MD  Laurine Blazer, LPN; Logan Bores, [place] T        I called and left a detailed voicemail. I told him that I will reschedule his appointment. I told him that if he is having significant symptoms that he is concerned about that I would be happy to see him next Monday. Please go ahead and reschedule for now.

## 2018-05-08 NOTE — Telephone Encounter (Signed)
   Primary Cardiologist:  Dr. Shawna Orleans    Patient contacted.  History reviewed.  No symptoms to suggest any unstable cardiac conditions.  Based on discussion, with current pandemic situation, we will be postponing this appointment till 07/17/2018.  If symptoms change, he has been instructed to contact our office.

## 2018-05-11 ENCOUNTER — Ambulatory Visit: Payer: BLUE CROSS/BLUE SHIELD | Admitting: Cardiovascular Disease

## 2018-07-15 ENCOUNTER — Telehealth: Payer: Self-pay | Admitting: *Deleted

## 2018-07-15 NOTE — Telephone Encounter (Signed)
Pt verbalized consent for telehealth appt with Dr Bronson Ing 07/17/18. Thinks he has BP monitor at home, aware to have weight/meds available

## 2018-07-17 ENCOUNTER — Encounter: Payer: Self-pay | Admitting: Cardiovascular Disease

## 2018-07-17 ENCOUNTER — Telehealth (INDEPENDENT_AMBULATORY_CARE_PROVIDER_SITE_OTHER): Payer: BLUE CROSS/BLUE SHIELD | Admitting: Cardiovascular Disease

## 2018-07-17 VITALS — BP 130/82 | HR 65 | Ht 69.5 in | Wt 287.0 lb

## 2018-07-17 DIAGNOSIS — Z951 Presence of aortocoronary bypass graft: Secondary | ICD-10-CM

## 2018-07-17 DIAGNOSIS — I25708 Atherosclerosis of coronary artery bypass graft(s), unspecified, with other forms of angina pectoris: Secondary | ICD-10-CM

## 2018-07-17 DIAGNOSIS — Z8673 Personal history of transient ischemic attack (TIA), and cerebral infarction without residual deficits: Secondary | ICD-10-CM

## 2018-07-17 DIAGNOSIS — I10 Essential (primary) hypertension: Secondary | ICD-10-CM

## 2018-07-17 DIAGNOSIS — I6523 Occlusion and stenosis of bilateral carotid arteries: Secondary | ICD-10-CM

## 2018-07-17 DIAGNOSIS — E785 Hyperlipidemia, unspecified: Secondary | ICD-10-CM

## 2018-07-17 NOTE — Patient Instructions (Addendum)
Medication Instructions:  Continue all current medications.  Labwork: none  Testing/Procedures: none  Follow-Up: Your physician wants you to follow up in:  1 year.  You will receive a reminder letter in the mail one-two months in advance.  If you don't receive a letter, please call our office to schedule the follow up appointment   Any Other Special Instructions Will Be Listed Below (If Applicable). Plavix can be held 3-5 days prior to dental procedure.    If you need a refill on your cardiac medications before your next appointment, please call your pharmacy.

## 2018-07-17 NOTE — Progress Notes (Signed)
Virtual Visit via Telephone Note   This visit type was conducted due to national recommendations for restrictions regarding the COVID-19 Pandemic (e.g. social distancing) in an effort to limit this patient's exposure and mitigate transmission in our community.  Due to his co-morbid illnesses, this patient is at least at moderate risk for complications without adequate follow up.  This format is felt to be most appropriate for this patient at this time.  The patient did not have access to video technology/had technical difficulties with video requiring transitioning to audio format only (telephone).  All issues noted in this document were discussed and addressed.  No physical exam could be performed with this format.  Please refer to the patient's chart for his  consent to telehealth for [facility]   Date:  07/17/2018   ID:  [name], DOB 02-14-1961, MRN [id number]  Patient Location: Home Provider Location: Home  PCP:  Glenda Chroman, MD  Cardiologist:  Kate Sable, MD  Electrophysiologist:  None   Evaluation Performed:  Follow-Up Visit  Chief Complaint:  CAD  History of Present Illness:    GLADYS GUTMAN is a 58 y.o. male with coronary artery disease with a history of four-vessel CABG. He underwent 4 vessel CABG in October 1996 with a right internal mammary artery graft to the RCA, left internal mammary artery graft to the LAD, and saphenous vein grafts to the obtuse marginal and ramus intermedius.  Nuclear stress test on 09/06/16 was low risk, LVEF 56%, with no significant ischemia or scar.  Most recent echocardiogram dated 01/20/14 demonstrated normal left ventricular systolic function, LVEF 30-09%, with grade 1 diastolic dysfunction.  He has an implantable loop recorder for a history of cryptogenic stroke. He is on indefinite dual antiplatelet therapy with aspirin and Plavix.   The patient denies any symptoms of chest pain, palpitations, shortness of breath,  lightheadedness, dizziness, leg swelling, orthopnea, PND, and syncope.  The patient does not have symptoms concerning for COVID-19 infection (fever, chills, cough, or new shortness of breath).    Past Medical History:  Diagnosis Date  . CAD (coronary artery disease), native coronary artery 10/96   4 Vessel   . Chronic sinusitis   . Essential hypertension   . Gout   . History of stroke    August 2015 - Small acute posterior left frontal lobe infarcts   . Hyperlipidemia   . Kidney stone   . Stroke ([place])   . TIA (transient ischemic attack)    Past Surgical History:  Procedure Laterality Date  . COLONOSCOPY N/A 09/03/2012   Procedure: COLONOSCOPY;  Surgeon: Rogene Houston, MD;  [facility];  Service: Endoscopy;  Laterality: N/A;  1200  . CORONARY ARTERY BYPASS GRAFT  12/12/1994   LIMA to LAD, RIMA to mid RCA, reverse SVG to intermediate branch of left coronary artery.   . Implantable loop recorder  August 2015   Dr. Rayann Heman A Medtronic Reveal [place] model [place] SN [id number] implantable loop recorder was then placed into the pocket  R waves were very prominent and measured 0.14mV  . LOOP RECORDER IMPLANT N/A 10/01/2013   Procedure: LOOP RECORDER IMPLANT;  Surgeon: Coralyn Mark, MD;  Location: [facility] CATH LAB;  Service: Cardiovascular;  Laterality: N/A;  . Loop recorder interrogation  Novemeber 17,  2015   Carelink summary report received. Battery status OK. Normal device function. No symptom episodes, tachy episodes, brady or pause episodes.   . TEE WITHOUT CARDIOVERSION N/A 10/01/2013  Procedure: TRANSESOPHAGEAL ECHOCARDIOGRAM (TEE);  Surgeon: Sanda Klein, MD;  Location: [facility] ENDOSCOPY;  Service: Cardiovascular;  Laterality: N/A;  loop to follow     Current Meds  Medication Sig  . aspirin EC 81 MG tablet Take 81 mg by mouth daily.  . clopidogrel (PLAVIX) 75 MG tablet TAKE 1 TABLET BY MOUTH EVERY DAY - pt needs APPOINTMENT  . Coenzyme Q10 (CO Q-10) 100 MG CAPS Take by mouth.   Marland Kitchen lisinopril (PRINIVIL,ZESTRIL) 10 MG tablet Take 10 mg by mouth daily.   Marland Kitchen loratadine (CLARITIN) 10 MG tablet Take by mouth.  . metoprolol tartrate (LOPRESSOR) 25 MG tablet Take 12.5 mg by mouth 2 (two) times daily.   . mometasone (NASONEX) 50 MCG/ACT nasal spray Place 2 sprays into both nostrils daily as needed (for allergies).   . nitroGLYCERIN (NITROSTAT) 0.4 MG SL tablet Place 1 tablet (0.4 mg total) under the tongue every 5 (five) minutes as needed for chest pain.  . ranitidine (ZANTAC) 150 MG capsule Take 150 mg by mouth daily.  . rosuvastatin (CRESTOR) 40 MG tablet Take 40 mg by mouth every morning.     Allergies:   Codeine   Social History   Tobacco Use  . Smoking status: Former Smoker    Types: Cigarettes    Last attempt to quit: 07/20/1994    Years since quitting: 24.0  . Smokeless tobacco: Never Used  Substance Use Topics  . Alcohol use: No    Alcohol/week: 0.0 standard drinks  . Drug use: No     Family Hx: The patient's family history includes Hypertension in his father and mother.  ROS:   Please see the history of present illness.     All other systems reviewed and are negative.   Prior CV studies:   The following studies were reviewed today:  See above  Labs/Other Tests and Data Reviewed:    EKG:  No ECG reviewed.  Recent Labs: No results found for requested labs within last 8760 hours.   Recent Lipid Panel Lab Results  Component Value Date/Time   CHOL 155 01/20/2014 05:56 AM   TRIG 124 01/20/2014 05:56 AM   HDL 33 (L) 01/20/2014 05:56 AM   CHOLHDL 4.7 01/20/2014 05:56 AM   LDLCALC 97 01/20/2014 05:56 AM    Wt Readings from Last 3 Encounters:  07/17/18 287 lb (130.2 kg)  05/07/17 281 lb (127.5 kg)  08/28/16 278 lb (126.1 kg)     Objective:    Vital Signs:  BP 130/82   Pulse 65   Ht 5' 9.5" (1.765 m)   Wt 287 lb (130.2 kg)   BMI 41.77 kg/m    VITAL SIGNS:  reviewed  ASSESSMENT & PLAN:    1. Coronary artery disease with history  of 4 vessel CABG: Symptomatically stable.  Nuclear stress test was low risk as detailed above. Continue aspirin, Plavix, lisinopril, metoprolol, and Crestor. Plavix can be held 3-5 days prior to any dental surgery.   2. Hypertension: Blood pressure is normal.  No changes to therapy.  3. Hyperlipidemia: He took Praluent for some time but stopped taking it about mid February 2019 due to diffuse body pains.  Since stopping it his symptoms have resolved.   He is on Crestor 40 mg. I will obtain a copy of lipids from PCP.  4.  Bilateral carotid artery stenosis: I reviewed his Dopplers performed in June 2018.  It revealed less than 50% stenosis bilaterally.  He remains on aspirin, Plavix, and Crestor.  5.  History of cryptogenic stroke: I reviewed most recent loop recorder interrogation which showed no evidence of atrial fibrillation.  He remains on aspirin, Plavix, and Crestor.    COVID-19 Education: The signs and symptoms of COVID-19 were discussed with the patient and how to seek care for testing (follow up with PCP or arrange E-visit).  The importance of social distancing was discussed today.  Time:   Today, I have spent 15 minutes with the patient with telehealth technology discussing the above problems.     Medication Adjustments/Labs and Tests Ordered: Current medicines are reviewed at length with the patient today.  Concerns regarding medicines are outlined above.   Tests Ordered: No orders of the defined types were placed in this encounter.   Medication Changes: No orders of the defined types were placed in this encounter.   Disposition:  Follow up in 1 year(s)  Signed, Kate Sable, MD  07/17/2018 1:34 PM    [facility]

## 2018-08-04 ENCOUNTER — Encounter: Payer: Self-pay | Admitting: *Deleted

## 2018-12-16 ENCOUNTER — Other Ambulatory Visit: Payer: Self-pay | Admitting: Cardiovascular Disease

## 2018-12-16 DIAGNOSIS — Z8673 Personal history of transient ischemic attack (TIA), and cerebral infarction without residual deficits: Secondary | ICD-10-CM

## 2019-01-12 ENCOUNTER — Encounter (HOSPITAL_COMMUNITY): Payer: Self-pay | Admitting: Emergency Medicine

## 2019-01-12 ENCOUNTER — Encounter (HOSPITAL_COMMUNITY): Admission: EM | Disposition: E | Payer: Self-pay | Source: Home / Self Care | Attending: Emergency Medicine

## 2019-01-12 ENCOUNTER — Emergency Department (HOSPITAL_COMMUNITY)
Admission: EM | Admit: 2019-01-12 | Discharge: 2019-01-19 | Disposition: E | Payer: BLUE CROSS/BLUE SHIELD | Attending: Emergency Medicine | Admitting: Emergency Medicine

## 2019-01-12 DIAGNOSIS — Z87891 Personal history of nicotine dependence: Secondary | ICD-10-CM | POA: Insufficient documentation

## 2019-01-12 DIAGNOSIS — Z79899 Other long term (current) drug therapy: Secondary | ICD-10-CM | POA: Insufficient documentation

## 2019-01-12 DIAGNOSIS — I469 Cardiac arrest, cause unspecified: Secondary | ICD-10-CM | POA: Insufficient documentation

## 2019-01-12 DIAGNOSIS — Z951 Presence of aortocoronary bypass graft: Secondary | ICD-10-CM | POA: Insufficient documentation

## 2019-01-12 DIAGNOSIS — I2510 Atherosclerotic heart disease of native coronary artery without angina pectoris: Secondary | ICD-10-CM | POA: Insufficient documentation

## 2019-01-12 DIAGNOSIS — I10 Essential (primary) hypertension: Secondary | ICD-10-CM | POA: Insufficient documentation

## 2019-01-12 DIAGNOSIS — Z7982 Long term (current) use of aspirin: Secondary | ICD-10-CM | POA: Insufficient documentation

## 2019-01-12 LAB — CBC WITH DIFFERENTIAL/PLATELET
Abs Immature Granulocytes: 0.39 10*3/uL — ABNORMAL HIGH (ref 0.00–0.07)
Basophils Absolute: 0 10*3/uL (ref 0.0–0.1)
Basophils Relative: 1 %
Eosinophils Absolute: 0.2 10*3/uL (ref 0.0–0.5)
Eosinophils Relative: 2 %
HCT: 44.8 % (ref 39.0–52.0)
Hemoglobin: 13 g/dL (ref 13.0–17.0)
Immature Granulocytes: 4 %
Lymphocytes Relative: 63 %
Lymphs Abs: 5.6 10*3/uL — ABNORMAL HIGH (ref 0.7–4.0)
MCH: 30 pg (ref 26.0–34.0)
MCHC: 29 g/dL — ABNORMAL LOW (ref 30.0–36.0)
MCV: 103.5 fL — ABNORMAL HIGH (ref 80.0–100.0)
Monocytes Absolute: 0.5 10*3/uL (ref 0.1–1.0)
Monocytes Relative: 5 %
Neutro Abs: 2.2 10*3/uL (ref 1.7–7.7)
Neutrophils Relative %: 25 %
Platelets: 96 10*3/uL — ABNORMAL LOW (ref 150–400)
RBC: 4.33 MIL/uL (ref 4.22–5.81)
RDW: 11.4 % — ABNORMAL LOW (ref 11.5–15.5)
WBC: 8.8 10*3/uL (ref 4.0–10.5)
nRBC: 0.6 % — ABNORMAL HIGH (ref 0.0–0.2)

## 2019-01-12 LAB — COMPREHENSIVE METABOLIC PANEL
ALT: 96 U/L — ABNORMAL HIGH (ref 0–44)
AST: 107 U/L — ABNORMAL HIGH (ref 15–41)
Albumin: 2 g/dL — ABNORMAL LOW (ref 3.5–5.0)
Alkaline Phosphatase: 76 U/L (ref 38–126)
Anion gap: 19 — ABNORMAL HIGH (ref 5–15)
BUN: 14 mg/dL (ref 6–20)
CO2: 13 mmol/L — ABNORMAL LOW (ref 22–32)
Calcium: 8.1 mg/dL — ABNORMAL LOW (ref 8.9–10.3)
Chloride: 114 mmol/L — ABNORMAL HIGH (ref 98–111)
Creatinine, Ser: 1.21 mg/dL (ref 0.61–1.24)
GFR calc Af Amer: >60 mL/min (ref >60)
GFR calc non Af Amer: >60 mL/min (ref >60)
Glucose, Bld: 339 mg/dL — ABNORMAL HIGH (ref 70–99)
Potassium: 2.8 mmol/L — ABNORMAL LOW (ref 3.5–5.1)
Sodium: 146 mmol/L — ABNORMAL HIGH (ref 135–145)
Total Bilirubin: 0.7 mg/dL (ref 0.3–1.2)
Total Protein: 4 g/dL — ABNORMAL LOW (ref 6.5–8.1)

## 2019-01-12 LAB — PROTIME-INR

## 2019-01-12 LAB — TROPONIN I (HIGH SENSITIVITY): Troponin I (High Sensitivity): 1247 ng/L (ref <18)

## 2019-01-12 SURGERY — LEFT HEART CATH AND CORONARY ANGIOGRAPHY
Anesthesia: LOCAL

## 2019-01-12 MED ORDER — SODIUM BICARBONATE 8.4 % IV SOLN
INTRAVENOUS | Status: DC | PRN
  Administered 2019-01-12: 100 meq via INTRAVENOUS

## 2019-01-12 MED ORDER — EPINEPHRINE 1 MG/10ML IJ SOSY
PREFILLED_SYRINGE | INTRAMUSCULAR | Status: DC | PRN
  Administered 2019-01-12 (×6): 1 via INTRAVENOUS

## 2019-01-12 MED ORDER — HEPARIN (PORCINE) IN NACL 1000-0.9 UT/500ML-% IV SOLN
INTRAVENOUS | Status: DC | PRN
  Administered 2019-01-12 (×2): 500 mL

## 2019-01-12 MED ORDER — CALCIUM CHLORIDE 10 % IV SOLN
INTRAVENOUS | Status: DC | PRN
  Administered 2019-01-12: 1 g via INTRAVENOUS

## 2019-01-12 MED FILL — Medication: Qty: 1 | Status: AC

## 2019-01-19 NOTE — ED Notes (Signed)
Carelink/ activate code stemi . called @ 1223- per dr Ronnald Nian , called by Kaislee Chao

## 2019-01-19 NOTE — ED Notes (Signed)
Interventional Cardiology   Code STEMI activated by ER staff on 58 year old gentleman who collapsed at work and had CPR started within 8 to 10 minutes of collapse by EMS.  At the time of evaluation he has had CPR intermittently for greater than 60 minutes.  Longest period of rhythm with palpable pulse less than 2 minutes.  Frothy pulmonary edema fluid is noted from the endotracheal tube.  EKG demonstrates diffuse ischemic change with interventricular conduction delay.  CPR was being performed throughout the period of time that I assessed the patient in the ED.  The patient is not a candidate to go to the Cath Lab for intervention due to prolonged CPR, PEA, and likely significant anoxic/hypoxic brain injury.  STEMI activation canceled.  Further management per ER staff.

## 2019-01-19 NOTE — Code Documentation (Signed)
Pt to ED via [facility] - CPR in progress-  1204 shocked 120J   EPI, CaCl, and Bicarb given  1209 pulse check- pulseless- VFib, Shocked at 120J 1213 pulse check-- pulseless VFib shocked at 200J 1218 Pulse check - pulses present- junctional rhythm,  1220 no pulses CPR restarted. 1224 VFIb shocked at 200J 1226 pulse check - no pulses, VFib SHocked 200J 1230 CPR continuing  1232 Junctional EPi gtt started  1238 Pulse check-= Pulseless VFIb  Extubated in preparation for family to see Cardiology at bedside  1239 IV 18G in RAC per Dr. Ronnald Nian 1241- CPR continued 1251-- CPR stopped, pt in fine VFib, to asystole. Pupils fixed/dilated. Pronounced per Dr. Ronnald Nian
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
## 2019-01-19 NOTE — Progress Notes (Signed)
Responded to page from [facility] for family support for death of Mr. Mullarkey. I was present to escort daughter (Page) who is an Glass blower/designer at [employer], and family to the morgue for viewing. I offered spiritual care with words of comfort, ministry of presence, and prayer during their time of grief.   [facility] the [facility], Marjory Lies the security guard and I were present with the family. I  escorted family as they exited the building.   Chaplain Resident  Fidel Levy  [phone]

## 2019-01-19 DEATH — deceased
